# Patient Record
Sex: Female | Born: 1980
Health system: Southern US, Community
[De-identification: ages and names within clinical notes are randomized; demographics above are authoritative.]

## PROBLEM LIST (undated history)

## (undated) DIAGNOSIS — IMO0002 Reserved for concepts with insufficient information to code with codable children: Secondary | ICD-10-CM

## (undated) DIAGNOSIS — T7840XA Allergy, unspecified, initial encounter: Secondary | ICD-10-CM

## (undated) DIAGNOSIS — E785 Hyperlipidemia, unspecified: Secondary | ICD-10-CM

## (undated) DIAGNOSIS — I10 Essential (primary) hypertension: Secondary | ICD-10-CM

## (undated) HISTORY — DX: Essential (primary) hypertension: I10

## (undated) HISTORY — DX: Allergy, unspecified, initial encounter: T78.40XA

## (undated) HISTORY — PX: APPENDECTOMY: SHX54

## (undated) HISTORY — DX: Hyperlipidemia, unspecified: E78.5

## (undated) HISTORY — DX: Reserved for concepts with insufficient information to code with codable children: IMO0002

---

## 1999-03-27 ENCOUNTER — Emergency Department (HOSPITAL_COMMUNITY): Admission: EM | Admit: 1999-03-27 | Discharge: 1999-03-28 | Payer: Self-pay | Admitting: Emergency Medicine

## 1999-03-28 ENCOUNTER — Emergency Department (HOSPITAL_COMMUNITY): Admission: EM | Admit: 1999-03-28 | Discharge: 1999-03-28 | Payer: Self-pay | Admitting: Emergency Medicine

## 1999-03-30 ENCOUNTER — Emergency Department (HOSPITAL_COMMUNITY): Admission: EM | Admit: 1999-03-30 | Discharge: 1999-03-30 | Payer: Self-pay | Admitting: Emergency Medicine

## 2001-02-09 ENCOUNTER — Encounter: Payer: Self-pay | Admitting: Family Medicine

## 2001-02-09 ENCOUNTER — Ambulatory Visit (HOSPITAL_COMMUNITY): Admission: RE | Admit: 2001-02-09 | Discharge: 2001-02-09 | Payer: Self-pay | Admitting: Family Medicine

## 2003-02-07 ENCOUNTER — Other Ambulatory Visit: Admission: RE | Admit: 2003-02-07 | Discharge: 2003-02-07 | Payer: Self-pay | Admitting: Obstetrics & Gynecology

## 2007-10-14 ENCOUNTER — Inpatient Hospital Stay (HOSPITAL_COMMUNITY): Admission: AD | Admit: 2007-10-14 | Discharge: 2007-10-15 | Payer: Self-pay | Admitting: Obstetrics & Gynecology

## 2007-11-14 ENCOUNTER — Encounter (INDEPENDENT_AMBULATORY_CARE_PROVIDER_SITE_OTHER): Payer: Self-pay | Admitting: Surgery

## 2007-11-14 ENCOUNTER — Encounter: Payer: Self-pay | Admitting: Family Medicine

## 2007-11-14 ENCOUNTER — Inpatient Hospital Stay (HOSPITAL_COMMUNITY): Admission: EM | Admit: 2007-11-14 | Discharge: 2007-11-15 | Payer: Self-pay | Admitting: Emergency Medicine

## 2008-01-23 ENCOUNTER — Ambulatory Visit: Payer: Self-pay | Admitting: Gastroenterology

## 2009-04-28 ENCOUNTER — Encounter: Admission: RE | Admit: 2009-04-28 | Discharge: 2009-04-28 | Payer: Self-pay | Admitting: Family Medicine

## 2010-06-19 ENCOUNTER — Emergency Department (HOSPITAL_COMMUNITY): Admission: EM | Admit: 2010-06-19 | Discharge: 2010-06-19 | Payer: Self-pay | Admitting: Family Medicine

## 2010-06-22 ENCOUNTER — Emergency Department (HOSPITAL_COMMUNITY): Admission: EM | Admit: 2010-06-22 | Discharge: 2010-06-22 | Payer: Self-pay | Admitting: Emergency Medicine

## 2010-10-25 ENCOUNTER — Encounter: Payer: Self-pay | Admitting: Family Medicine

## 2010-12-17 LAB — LIPASE, BLOOD: Lipase: 70 U/L — ABNORMAL HIGH (ref 11–59)

## 2010-12-17 LAB — URINALYSIS, ROUTINE W REFLEX MICROSCOPIC
Bilirubin Urine: NEGATIVE
Hgb urine dipstick: NEGATIVE
Ketones, ur: NEGATIVE mg/dL
Nitrite: NEGATIVE
Specific Gravity, Urine: 1.014 (ref 1.005–1.030)
Urobilinogen, UA: 0.2 mg/dL (ref 0.0–1.0)

## 2010-12-17 LAB — DIFFERENTIAL
Basophils Absolute: 0.1 10*3/uL (ref 0.0–0.1)
Basophils Relative: 0 % (ref 0–1)
Eosinophils Absolute: 0.4 10*3/uL (ref 0.0–0.7)
Eosinophils Relative: 3 % (ref 0–5)
Lymphocytes Relative: 10 % — ABNORMAL LOW (ref 12–46)
Monocytes Absolute: 1.4 10*3/uL — ABNORMAL HIGH (ref 0.1–1.0)

## 2010-12-17 LAB — COMPREHENSIVE METABOLIC PANEL
ALT: 18 U/L (ref 0–35)
AST: 19 U/L (ref 0–37)
Albumin: 3.7 g/dL (ref 3.5–5.2)
Alkaline Phosphatase: 63 U/L (ref 39–117)
CO2: 29 mEq/L (ref 19–32)
Chloride: 98 mEq/L (ref 96–112)
Creatinine, Ser: 0.82 mg/dL (ref 0.4–1.2)
GFR calc Af Amer: >60 mL/min (ref >60)
GFR calc non Af Amer: >60 mL/min (ref >60)
Potassium: 3.7 mEq/L (ref 3.5–5.1)
Total Bilirubin: 0.3 mg/dL (ref 0.3–1.2)

## 2010-12-17 LAB — POCT URINALYSIS DIPSTICK
Nitrite: NEGATIVE
Specific Gravity, Urine: 1.02 (ref 1.005–1.030)

## 2010-12-17 LAB — CBC
Hemoglobin: 12.6 g/dL (ref 12.0–15.0)
MCH: 27.8 pg (ref 26.0–34.0)
Platelets: 304 10*3/uL (ref 150–400)
RBC: 4.53 MIL/uL (ref 3.87–5.11)
WBC: 13.6 10*3/uL — ABNORMAL HIGH (ref 4.0–10.5)

## 2010-12-17 LAB — URINE MICROSCOPIC-ADD ON

## 2010-12-17 LAB — POCT PREGNANCY, URINE: Preg Test, Ur: NEGATIVE

## 2010-12-17 LAB — HEMOCCULT GUIAC POC 1CARD (OFFICE): Fecal Occult Bld: NEGATIVE

## 2011-02-16 NOTE — Op Note (Signed)
[name], Julia [name]             ACCOUNT NO.:  [id number]   MEDICAL RECORD NO.:  [id number]          PATIENT TYPE:  INP   LOCATION:  1519                         FACILITY:  [facility]   PHYSICIAN:  Thornton Park. Daphine Deutscher, MD  DATE OF BIRTH:  08/09/1981   DATE OF PROCEDURE:  11/14/2007  DATE OF DISCHARGE:  11/15/2007                               OPERATIVE REPORT   PREOPERATIVE DIAGNOSIS:  Acute appendicitis.   POSTOPERATIVE DIAGNOSIS:  Acute appendicitis.   PROCEDURE:  Laparoscopic appendectomy.   SURGEON:  Thornton Park. Daphine Deutscher, MD   ASSISTANT:  None.   DESCRIPTION OF PROCEDURE:  This is a redictation of this which they  apparently said they cannot find.  At approximately in the early morning  hours of November 14, 2007, she was taken to the operating room.  She  was put asleep and prepped with Techni-Care.  I entered the abdomen  through the umbilicus, with two other trocars, a 5 in the right upper  quadrant and a 10/11 in the left lower quadrant.  The appendix was  mobilized and transected at the base with the Endo-GIA and then the  mesentery was divided with a Harmonic scalpel.  It was placed in a bag  and brought out through the umbilicus.  The patient seemed to tolerate  the procedure well and was taken to the recovery room in satisfactory  condition.   FINAL DIAGNOSIS:  Acute appendicitis.      Thornton Park Daphine Deutscher, MD  Electronically Signed     MBM/MEDQ  D:  01/14/2008  T:  01/14/2008  Job:  [id number]

## 2011-02-16 NOTE — Assessment & Plan Note (Signed)
[name]                         GASTROENTEROLOGY OFFICE NOTE   NAME:[name], Julia [name]                    MRN:          [id number]  DATE:01/23/2008                            DOB:          Sep 05, 1981    CHIEF COMPLAINT:  Dyspeptic symptoms.   HISTORY OF PRESENT ILLNESS:  Julia [name] is a very pleasant 30 year old  woman who has had approximately 4-5 months of abdominal symptoms.  Specifically she has gurgling in her abdomen especially when it is  quite.  She was having some intermittent abdominal pains.  She has also  noticed a loosening of her stools, more runny than usual.  She still  only moves her bowels once a day but has noticed a change in the  character.  She was seen at [facility] and was treated  empirically with Flagyl twice for the suspicion of Giardia.  She  definitely felt better after those treatments but has noted recently  that the belching and bloating and loosening of her stools recurring.  She has had blood tests and stool testing.  She tells me that Giardia  was not found in her stool.  Her liver tests that were sent to [place] were  all completely normal.   Of interest, she and her mother and most of her family are lactose  intolerant.  She drinks Lactate brand milk, but she has been eating  yogurt, 1-2 yogurts a day for the past several months.  She has not been  taking Lactaid pills.   REVIEW OF SYSTEMS:  Notable for intentional 10 pound weight loss and is  otherwise essentially normal and is available on her nursing intake  sheet.   PAST MEDICAL HISTORY:  1. Mild asthma.  2. Seasonal allergies.  3. Status post appendectomy February 2009.   CURRENT MEDICINES:  Alavert, Nasonex, birth control pills.   ALLERGIES:  SULFA ANTIBIOTICS.   SOCIAL HISTORY:  Single.  Works as a Education officer, environmental.  Nonsmoker.  Nondrinker.  Drinks 2 caffeinated beverages a day.  No children.   FAMILY HISTORY:  Diabetes and heart disease  run in her family.  No colon  cancer in her family.   PHYSICAL EXAMINATION:  Height is 5 feet 4 inches, 169 pounds.  Blood  pressure 110/70.  Pulse 72.  CONSTITUTIONAL:  Generally well-appearing.  NEUROLOGIC:  Alert and oriented x3.  HEENT:  Eyes:  Extraocular eye movements intact.  Mouth:  Oropharynx  moist, no lesions.  NECK:  Supple.  No lymphadenopathy.  CARDIOVASCULAR:  Heart:  Regular rate rhythm.  LUNGS:  Clear to auscultation bilaterally.  ABDOMEN:  Soft, nontender, nondistended.  Normal bowel sounds.  EXTREMITIES:  No lower extremity edema.  SKIN:  No rashes or lesions on extremities.   ASSESSMENT AND PLAN:  A 30 year old woman with history of lactose  intolerance, belching, bloating, loose stools.   Suspicious that she is having continued troubles with lactose  intolerance.  She has been eating 1-2 yogurts a day for several months  and has symptoms that are definitely consistent with lactose  intolerance.  The question of Giardia is interesting and she may  have  indeed had Giardia.  Stool testing argues against that; however, we may  need to revisit that question in the future.  For now I want to keep  this simple, and I have recommended that she completely refrain from any  dairy product for the next 10 days.  If at the end of that time she is  feeling much better and has had no recurrence of her symptoms, then she  will have a day of dairy challenge.  She will return to see me in 4-5  weeks and sooner if needed.     Rachael Fee, MD  Electronically Signed    DPJ/MedQ  DD: 01/23/2008  DT: 01/23/2008  Job #: [id number]

## 2011-04-26 ENCOUNTER — Inpatient Hospital Stay (INDEPENDENT_AMBULATORY_CARE_PROVIDER_SITE_OTHER)
Admission: RE | Admit: 2011-04-26 | Discharge: 2011-04-26 | Disposition: A | Discharge status: Home / Self Care | Payer: BC Managed Care – PPO | Source: Ambulatory Visit | Attending: Emergency Medicine | Admitting: Emergency Medicine

## 2011-04-26 DIAGNOSIS — J029 Acute pharyngitis, unspecified: Secondary | ICD-10-CM

## 2011-06-24 LAB — URINALYSIS, ROUTINE W REFLEX MICROSCOPIC
Bilirubin Urine: NEGATIVE
Hgb urine dipstick: NEGATIVE
Ketones, ur: NEGATIVE
Specific Gravity, Urine: 1.01
Urobilinogen, UA: 0.2
pH: 7.5

## 2011-06-24 LAB — POCT PREGNANCY, URINE
Operator id: 28886
Preg Test, Ur: NEGATIVE

## 2011-06-24 LAB — COMPREHENSIVE METABOLIC PANEL
CO2: 26
Calcium: 8.9
Creatinine, Ser: 0.86
GFR calc non Af Amer: >60
Glucose, Bld: 86
Total Protein: 7

## 2011-06-24 LAB — CBC
Hemoglobin: 12.2
MCHC: 34
MCV: 85.5
RBC: 4.2
RDW: 12.8

## 2011-06-24 LAB — DIFFERENTIAL
Lymphocytes Relative: 13
Lymphs Abs: 1.7
Neutro Abs: 10.2 — ABNORMAL HIGH
Neutrophils Relative %: 76

## 2011-06-24 LAB — WET PREP, GENITAL
Clue Cells Wet Prep HPF POC: NONE SEEN
Trich, Wet Prep: NONE SEEN
Yeast Wet Prep HPF POC: NONE SEEN

## 2011-06-24 LAB — GC/CHLAMYDIA PROBE AMP, GENITAL: GC Probe Amp, Genital: NEGATIVE

## 2011-06-25 LAB — URINALYSIS, ROUTINE W REFLEX MICROSCOPIC
Bilirubin Urine: NEGATIVE
Glucose, UA: NEGATIVE
Protein, ur: NEGATIVE

## 2011-06-25 LAB — CBC
HCT: 35.5 — ABNORMAL LOW
Hemoglobin: 11.5 — ABNORMAL LOW
Hemoglobin: 12
MCV: 83
MCV: 83.5
RBC: 4.07
RBC: 4.28
WBC: 10
WBC: 17 — ABNORMAL HIGH

## 2011-06-25 LAB — DIFFERENTIAL
Eosinophils Absolute: 0.4
Eosinophils Relative: 2
Lymphs Abs: 2.1
Monocytes Absolute: 1.3 — ABNORMAL HIGH
Monocytes Relative: 8

## 2011-06-25 LAB — BASIC METABOLIC PANEL
BUN: 7
Creatinine, Ser: 0.85
GFR calc non Af Amer: >60
Glucose, Bld: 94
Potassium: 3.7

## 2011-06-25 LAB — URINE MICROSCOPIC-ADD ON

## 2011-11-14 ENCOUNTER — Ambulatory Visit (INDEPENDENT_AMBULATORY_CARE_PROVIDER_SITE_OTHER): Payer: BC Managed Care – PPO | Admitting: Family Medicine

## 2011-11-14 DIAGNOSIS — E039 Hypothyroidism, unspecified: Secondary | ICD-10-CM

## 2011-11-14 DIAGNOSIS — J329 Chronic sinusitis, unspecified: Secondary | ICD-10-CM

## 2011-11-14 DIAGNOSIS — J45909 Unspecified asthma, uncomplicated: Secondary | ICD-10-CM | POA: Insufficient documentation

## 2011-11-14 DIAGNOSIS — I10 Essential (primary) hypertension: Secondary | ICD-10-CM | POA: Insufficient documentation

## 2011-11-14 DIAGNOSIS — J0100 Acute maxillary sinusitis, unspecified: Secondary | ICD-10-CM

## 2011-11-14 LAB — TSH: TSH: 3.236 u[IU]/mL (ref 0.350–4.500)

## 2011-11-14 MED ORDER — FLUTICASONE PROPIONATE 50 MCG/ACT NA SUSP: 2.0000 | Freq: Every day | NASAL | Status: DC

## 2011-11-14 MED ORDER — AMOXICILLIN 875 MG PO TABS: 875.0000 mg | ORAL_TABLET | Freq: Two times a day (BID) | ORAL | Status: DC

## 2011-11-14 MED ORDER — AMOXICILLIN 875 MG PO TABS: 875.0000 mg | ORAL_TABLET | Freq: Two times a day (BID) | ORAL | Status: AC

## 2011-11-14 NOTE — Progress Notes (Signed)
  Subjective:    Patient ID: Julia [name], female    DOB: 10-31-1980, 31 y.o.   MRN: [id number]  Sinusitis This is a new problem. The current episode started 1 to 4 weeks ago. The problem has been gradually worsening since onset. There has been no fever. Her pain is at a severity of 4/10. The pain is mild. Associated symptoms include congestion, headaches, sinus pressure and a sore throat. Past treatments include oral decongestants.      Review of Systems  HENT: Positive for congestion, sore throat and sinus pressure.   Neurological: Positive for headaches.       Objective:   Physical Exam  Constitutional: She appears well-developed and well-nourished.  HENT:  Right Ear: Tympanic membrane is retracted.  Left Ear: Tympanic membrane is retracted.  Nose: Mucosal edema, rhinorrhea and sinus tenderness present.    Mouth/Throat: Mucous membranes are normal. Posterior oropharyngeal erythema: pnd.  Cardiovascular: Normal rate, regular rhythm and normal heart sounds.   Pulmonary/Chest: Effort normal and breath sounds normal.  Lymphadenopathy:    She has cervical adenopathy (1+ anterior nodes (B)).  Neurological: She is alert.  Skin: Skin is warm.          Assessment & Plan:

## 2011-11-18 NOTE — Progress Notes (Signed)
Quick Note:  Please call patient with normal labs. Thanks! KR  ______ 

## 2012-03-21 ENCOUNTER — Other Ambulatory Visit: Payer: Self-pay | Admitting: Family Medicine

## 2012-03-22 ENCOUNTER — Other Ambulatory Visit: Payer: Self-pay | Admitting: *Deleted

## 2012-03-22 MED ORDER — LEVOTHYROXINE SODIUM 75 MCG PO TABS: 75.0000 ug | ORAL_TABLET | Freq: Every day | ORAL | Status: DC

## 2012-04-18 ENCOUNTER — Ambulatory Visit (INDEPENDENT_AMBULATORY_CARE_PROVIDER_SITE_OTHER): Payer: BC Managed Care – PPO | Admitting: Physician Assistant

## 2012-04-18 VITALS — BP 120/82 | HR 71 | Temp 98.2°F | Resp 16 | Ht 63.0 in | Wt 171.2 lb

## 2012-04-18 DIAGNOSIS — J45909 Unspecified asthma, uncomplicated: Secondary | ICD-10-CM

## 2012-04-18 DIAGNOSIS — E039 Hypothyroidism, unspecified: Secondary | ICD-10-CM

## 2012-04-18 DIAGNOSIS — I10 Essential (primary) hypertension: Secondary | ICD-10-CM

## 2012-04-18 DIAGNOSIS — N946 Dysmenorrhea, unspecified: Secondary | ICD-10-CM

## 2012-04-18 MED ORDER — MELOXICAM 7.5 MG PO TABS: 7.5000 mg | ORAL_TABLET | Freq: Every day | ORAL | Status: AC | PRN

## 2012-04-18 MED ORDER — LISINOPRIL 10 MG PO TABS: 10.0000 mg | ORAL_TABLET | Freq: Every day | ORAL | Status: DC

## 2012-04-18 MED ORDER — ALBUTEROL SULFATE HFA 108 (90 BASE) MCG/ACT IN AERS: 2.0000 | INHALATION_SPRAY | Freq: Four times a day (QID) | RESPIRATORY_TRACT | Status: DC | PRN

## 2012-04-18 MED ORDER — MONTELUKAST SODIUM 10 MG PO TABS: 10.0000 mg | ORAL_TABLET | Freq: Every day | ORAL | Status: DC

## 2012-04-18 NOTE — Progress Notes (Signed)
  Subjective:    Patient ID: Julia [name], female    DOB: 1981/09/13, 31 y.o.   MRN: [id number]  HPI Pt presents to clinic for recheck medical problems and rx refills.  She is doing well with her BP meds.  Her asthma is under good control - she rarely has to use her albuterol.  She does c/o menstrual cramping that is severe that starts 1 wk prior to menses and continues throughout her bleeding.  She has been on OCP in the past but not sexually active so wants to try to stay off hormones but needs something more than motrin for her cramps.   Review of Systems  Constitutional: Negative for fever and chills.  Respiratory: Negative for cough.   Cardiovascular: Negative for chest pain and leg swelling.  Genitourinary: Positive for menstrual problem.       Objective:   Physical Exam  Vitals reviewed. Constitutional: She is oriented to person, place, and time. She appears well-developed and well-nourished.  HENT:  Head: Normocephalic and atraumatic.  Right Ear: External ear normal.  Left Ear: External ear normal.  Nose: Nose normal.  Eyes: Conjunctivae are normal.  Neck: Normal range of motion. No thyromegaly present.  Cardiovascular: Normal rate, regular rhythm and normal heart sounds.   No murmur heard. Pulmonary/Chest: Effort normal and breath sounds normal.       No LE edema   Neurological: She is alert and oriented to person, place, and time.  Skin: Skin is warm and dry.  Psychiatric: She has a normal mood and affect. Her behavior is normal. Judgment and thought content normal.          Assessment & Plan:   1. Hypothyroidism  TSH  2. HTN (hypertension)  Comprehensive metabolic panel, lisinopril (PRINIVIL,ZESTRIL) 10 MG tablet  3. Asthma  montelukast (SINGULAIR) 10 MG tablet, albuterol (PROVENTIL HFA;VENTOLIN HFA) 108 (90 BASE) MCG/ACT inhaler  4. Dysmenorrhea  meloxicam (MOBIC) 7.5 MG tablet   Refilled patient's medication. Will refill her synthroid once her labs  return.  Will try mobic to see if improvement in dysmenorrhea - pt's last pap 1/10 and we will plan on doing that in 6 months.  Pt understands and agrees with the above plan.

## 2012-04-19 LAB — COMPREHENSIVE METABOLIC PANEL
ALT: 15 U/L (ref 0–35)
AST: 18 U/L (ref 0–37)
Albumin: 4.6 g/dL (ref 3.5–5.2)
BUN: 10 mg/dL (ref 6–23)
Calcium: 9.8 mg/dL (ref 8.4–10.5)
Chloride: 103 mEq/L (ref 96–112)
Potassium: 4.1 mEq/L (ref 3.5–5.3)

## 2012-04-19 LAB — TSH: TSH: 9.277 u[IU]/mL — ABNORMAL HIGH (ref 0.350–4.500)

## 2012-04-20 ENCOUNTER — Other Ambulatory Visit: Payer: Self-pay | Admitting: Physician Assistant

## 2012-04-20 DIAGNOSIS — E039 Hypothyroidism, unspecified: Secondary | ICD-10-CM

## 2012-04-20 MED ORDER — LEVOTHYROXINE SODIUM 88 MCG PO TABS: 88.0000 ug | ORAL_TABLET | Freq: Every day | ORAL | Status: DC

## 2012-06-14 ENCOUNTER — Telehealth: Payer: Self-pay

## 2012-06-14 DIAGNOSIS — E039 Hypothyroidism, unspecified: Secondary | ICD-10-CM

## 2012-06-14 NOTE — Telephone Encounter (Signed)
Maralyn Sago, you wanted pt to recheck TSH in 2 mos (from 04/18/12). Do you want to authorize 1 more RF or must pt repeat labs first?

## 2012-06-14 NOTE — Telephone Encounter (Signed)
Patient would like to know if she can have another refill of synthroid before having an OV. Please call patient at [phone].

## 2012-06-15 MED ORDER — LEVOTHYROXINE SODIUM 88 MCG PO TABS: 88.0000 ug | ORAL_TABLET | Freq: Every day | ORAL | Status: DC

## 2012-06-15 NOTE — Telephone Encounter (Signed)
I sent in a RF but she must have an ov before more refills are given.

## 2012-06-15 NOTE — Telephone Encounter (Signed)
Called patient to advise Rx sent in for her, and she needs office visit for further renewals.

## 2012-07-05 ENCOUNTER — Ambulatory Visit (INDEPENDENT_AMBULATORY_CARE_PROVIDER_SITE_OTHER): Payer: BC Managed Care – PPO | Admitting: Family Medicine

## 2012-07-05 VITALS — BP 124/78 | HR 83 | Temp 98.0°F | Resp 17 | Ht 63.5 in | Wt 171.0 lb

## 2012-07-05 DIAGNOSIS — N946 Dysmenorrhea, unspecified: Secondary | ICD-10-CM

## 2012-07-05 DIAGNOSIS — K219 Gastro-esophageal reflux disease without esophagitis: Secondary | ICD-10-CM

## 2012-07-05 DIAGNOSIS — J029 Acute pharyngitis, unspecified: Secondary | ICD-10-CM

## 2012-07-05 DIAGNOSIS — F79 Unspecified intellectual disabilities: Secondary | ICD-10-CM | POA: Insufficient documentation

## 2012-07-05 DIAGNOSIS — R110 Nausea: Secondary | ICD-10-CM

## 2012-07-05 DIAGNOSIS — E039 Hypothyroidism, unspecified: Secondary | ICD-10-CM

## 2012-07-05 LAB — POCT CBC
Hemoglobin: 12.4 g/dL (ref 12.2–16.2)
MPV: 11.2 fL (ref 0–99.8)
POC Granulocyte: 6.7 (ref 2–6.9)
POC MID %: 6.5 %M (ref 0–12)
RBC: 4.34 M/uL (ref 4.04–5.48)

## 2012-07-05 LAB — POCT URINALYSIS DIPSTICK
Bilirubin, UA: NEGATIVE
Glucose, UA: NEGATIVE
Leukocytes, UA: NEGATIVE
Nitrite, UA: NEGATIVE

## 2012-07-05 LAB — POCT UA - MICROSCOPIC ONLY
Casts, Ur, LPF, POC: NEGATIVE
RBC, urine, microscopic: NEGATIVE

## 2012-07-05 MED ORDER — SUCRALFATE 1 G PO TABS: 1.0000 g | ORAL_TABLET | Freq: Four times a day (QID) | ORAL | Status: DC

## 2012-07-05 MED ORDER — GI COCKTAIL ~~LOC~~: 30.0000 mL | Freq: Once | ORAL | Status: DC

## 2012-07-05 MED ORDER — OMEPRAZOLE 40 MG PO CPDR: 40.0000 mg | DELAYED_RELEASE_CAPSULE | Freq: Every day | ORAL | Status: DC

## 2012-07-05 MED ORDER — LEVONORGESTREL-ETHINYL ESTRAD 0.1-20 MG-MCG PO TABS: 1.0000 | ORAL_TABLET | Freq: Every day | ORAL | Status: DC

## 2012-07-05 NOTE — Progress Notes (Addendum)
[facility] [address], [place] [address] [phone]  Date:  07/05/2012   Name:  Julia [name]   DOB:  [birth year]   MRN:  [id number]  PCP:  No primary provider on file.    Chief Complaint: Nausea, Sore Throat and Abdominal Pain   History of Present Illness:  Julia [name] is a 31 y.o. very pleasant female patient who presents with the following:  Of note Julia [name] has a mental handicap- she is currently enrolled in HS.  Her mother is here with her today- she helps her with driving, etc.   She has been nauseated for 3 weeks and has noted stomach cramping. She has also had a sore throat for 3 weeks.  She has felt warm but has not had a fever that they have measured.    She has noted some right sided CP for about a week.  She notes it more when she lies down at night. She does not notice it when she dances for exercise.    No history of CAD.  No SOB- she does have some baseline asthma.   She did throw up some alka- selter yesterday.   No diarrhea.   Appetite is ok.  Eating does not change her symptoms.  Her discomfort can be worse when she lies down at night.   She has taken some ibuprofen as well.   She has been told that she had an ulcer in the past which healed with nexium.    She is also on pepcid [facility] currently.    Julia [name] is also concerned about pains and cramping that occur with her periods.  She was on OCP about 10 years ago.  She had some SE (not an allergic rxn) that caused her to stop using them.  However, she is willing to try again as her dysmenorrhea is significant.   Her LMP was in September- about 2 weeks ago.  No migraine HA.  No history of DVT or PE.    Patient Active Problem List  Diagnosis  . Asthma  . HTN (hypertension)  . Hypothyroid    Past Medical History  Diagnosis Date  . Allergy   . Asthma   . Hyperlipidemia   . Hypertension     No past surgical history on file.  History  Substance Use Topics  . Smoking  status: Never Smoker   . Smokeless tobacco: Not on file  . Alcohol Use: Not on file    No family history on file.  Allergies  Allergen Reactions  . Sulfa Antibiotics Nausea And Vomiting    Medication list has been reviewed and updated.  Current Outpatient Prescriptions on File Prior to Visit  Medication Sig Dispense Refill  . albuterol (PROVENTIL HFA;VENTOLIN HFA) 108 (90 BASE) MCG/ACT inhaler Inhale 2 puffs into the lungs every 6 (six) hours as needed for wheezing.  1 Inhaler  0  . ibuprofen (ADVIL,MOTRIN) 200 MG tablet Take 600 mg by mouth every 6 (six) hours as needed.      Marland Kitchen levothyroxine (SYNTHROID, LEVOTHROID) 88 MCG tablet Take 1 tablet (88 mcg total) by mouth daily.  30 tablet  0  . lisinopril (PRINIVIL,ZESTRIL) 10 MG tablet Take 1 tablet (10 mg total) by mouth daily.  90 tablet  1  . montelukast (SINGULAIR) 10 MG tablet Take 1 tablet (10 mg total) by mouth at bedtime.  90 tablet  3  . fluticasone (FLONASE) 50 MCG/ACT nasal spray Place 2 sprays into  the nose daily.  1 g  6  . meloxicam (MOBIC) 7.5 MG tablet Take 1-2 tablets (7.5-15 mg total) by mouth daily as needed for pain.  60 tablet  5    Review of Systems:  As per HPI- otherwise negative.   Physical Examination: Filed Vitals:   07/05/12 1010  BP: 124/78  Pulse: 83  Temp: 98 F (36.7 C)  Resp: 17   Filed Vitals:   07/05/12 1010  Height: 5' 3.5" (1.613 m)  Weight: 171 lb (77.565 kg)   Body mass index is 29.82 kg/(m^2). Ideal Body Weight: Weight in (lb) to have BMI = 25: 143.1   GEN: WDWN, NAD, Non-toxic, A & O x 3, overweight HEENT: Atraumatic, Normocephalic. Neck supple. No masses, No LAD. Bilateral TM wnl, oropharynx normal.  PEERL,EOMI.   Ears and Nose: No external deformity. CV: RRR, No M/G/R. No JVD. No thrill. No extra heart sounds. I am able to easily reproduce her CP by pressing on her right chest wall.  No lesion or redness of the skin.   PULM: CTA B, no wheezes, crackles, rhonchi. No  retractions. No resp. distress. No accessory muscle use. ABD: S, ND, +BS. No rebound. No HSM. Slight tenderness in the epigastric area.  EXTR: No c/c/e NEURO Normal gait.  PSYCH: slightly childish but pleasant demeanor. Conversant. Not depressed or anxious appearing.    Results for orders placed in visit on 07/05/12  POCT RAPID STREP A (OFFICE)      Component Value Range   Rapid Strep A Screen Negative  Negative  POCT CBC      Component Value Range   WBC 9.7  4.6 - 10.2 K/uL   Lymph, poc 2.4  0.6 - 3.4   POC LYMPH PERCENT 24.3  10 - 50 %L   MID (cbc) 0.6  0 - 0.9   POC MID % 6.5  0 - 12 %M   POC Granulocyte 6.7  2 - 6.9   Granulocyte percent 69.2  37 - 80 %G   RBC 4.34  4.04 - 5.48 M/uL   Hemoglobin 12.4  12.2 - 16.2 g/dL   HCT, POC 40.9  81.1 - 47.9 %   MCV 90.4  80 - 97 fL   MCH, POC 28.6  27 - 31.2 pg   MCHC 31.6 (*) 31.8 - 35.4 g/dL   RDW, POC 91.4     Platelet Count, POC 296  142 - 424 K/uL   MPV 11.2  0 - 99.8 fL  POCT URINALYSIS DIPSTICK      Component Value Range   Color, UA yellow     Clarity, UA clear     Glucose, UA neg     Bilirubin, UA neg     Ketones, UA neg     Spec Grav, UA 1.010     Blood, UA neg     pH, UA 7.0     Protein, UA neg     Urobilinogen, UA 0.2     Nitrite, UA neg     Leukocytes, UA Negative    POCT UA - MICROSCOPIC ONLY      Component Value Range   WBC, Ur, HPF, POC 0-1     RBC, urine, microscopic neg     Bacteria, U Microscopic trace     Mucus, UA neg     Epithelial cells, urine per micros 0-2     Crystals, Ur, HPF, POC neg     Casts, Ur, LPF, POC neg  Yeast, UA neg    POCT URINE PREGNANCY      Component Value Range   Preg Test, Ur Negative     GI cocktail: this did help with her nausea. Also partially relieved her stomach discomfort    Assessment and Plan: 1. GERD (gastroesophageal reflux disease)  gi cocktail (Maalox,Lidocaine,Donnatal), sucralfate (CARAFATE) 1 G tablet, omeprazole (PRILOSEC) 40 MG capsule  2. Sore throat   POCT rapid strep A, POCT CBC, Culture, Group A Strep  3. Hypothyroidism  TSH  4. Nausea  POCT urinalysis dipstick, POCT UA - Microscopic Only, POCT urine pregnancy  5. Menstrual cramps  levonorgestrel-ethinyl estradiol (AVIANE,ALESSE,LESSINA) 0.1-20 MG-MCG tablet  6. Mental handicap     Suspect that Julia [name]'s symptoms are caused by GERD.  She is on an H2 blocker right now- will change to a PPI.  She has done well with nexium but it is not covered well by their insurance.  Will use 40 mg prilosec instead.  Also carafate, and went over acidic foods to avoid.  Her CP is due to MSK soreness.   Encouraged her to use tylenol instead of NSAIDs for aches and pains, as NSAIDS may exacerbate her stomach problems.  Her ST is probably also due to acid reflux  Will start on OCP for dysmenorrhea.   Went over starting after next period and how to use pills.  If she does not tolerate her pills please let me know She is due a recheck of her TSH- await and will call with results.   Also gave a letter in response to a recent call- up for [employer] duty.  Explained that Julia [name] should be excused from jury duty permanently due to her handicap.    Abbe Amsterdam, MD 07/08/12- called and spoke with Julia [name].  Offered to speak with her mom, but Julia [name] felt comfortable with the information.  I let her know that her TSH was a little bit high, so I am going to increase her synthroid from 88 to 100 mcg, and will send this rx to her pharmacy.  We will plan to have them recheck a TSH in about 6 weeks.  Otherwise she stated that she is feeling better and doing ok overall. She did have another increase of her thyroid meds in July of this year.   I think she was on 50 mcg prior to this change

## 2012-07-07 LAB — CULTURE, GROUP A STREP: Organism ID, Bacteria: NORMAL

## 2012-07-08 MED ORDER — LEVOTHYROXINE SODIUM 100 MCG PO TABS: 100.0000 ug | ORAL_TABLET | Freq: Every day | ORAL | Status: DC

## 2012-07-08 NOTE — Addendum Note (Signed)
Addended by: Abbe Amsterdam C on: 07/08/2012 05:19 PM   Modules accepted: Orders

## 2012-08-22 ENCOUNTER — Ambulatory Visit (INDEPENDENT_AMBULATORY_CARE_PROVIDER_SITE_OTHER): Payer: BC Managed Care – PPO | Admitting: Emergency Medicine

## 2012-08-22 VITALS — BP 112/72 | HR 94 | Temp 97.6°F | Resp 18 | Ht 62.5 in | Wt 181.2 lb

## 2012-08-22 DIAGNOSIS — J018 Other acute sinusitis: Secondary | ICD-10-CM

## 2012-08-22 DIAGNOSIS — J029 Acute pharyngitis, unspecified: Secondary | ICD-10-CM

## 2012-08-22 MED ORDER — PSEUDOEPHEDRINE-GUAIFENESIN ER 60-600 MG PO TB12: 1.0000 | ORAL_TABLET | Freq: Two times a day (BID) | ORAL | Status: AC

## 2012-08-22 MED ORDER — AMOXICILLIN 875 MG PO TABS: 875.0000 mg | ORAL_TABLET | Freq: Two times a day (BID) | ORAL | Status: DC

## 2012-08-22 NOTE — Progress Notes (Signed)
[facility] [address], [place] [address] [phone]  Date:  08/22/2012   Name:  Julia [name]   DOB:  10-Mar-1981   MRN:  [id number]  PCP:  No primary provider on file.    Chief Complaint: Sinusitis, Sore Throat, Headache and Cough   History of Present Illness:  Julia [name] is a 31 y.o. very pleasant female patient who presents with the following:  Ill for two weeks with a sore throat and nasal congestion and post nasal drainage.  Bad tasting. Clear nasal discharge.  No fever or chills, wheezing or shortness of breath.  No nausea of vomiting.  Some headache.  Patient Active Problem List  Diagnosis  . Asthma  . HTN (hypertension)  . Hypothyroid  . Mental handicap    Past Medical History  Diagnosis Date  . Allergy   . Asthma   . Hyperlipidemia   . Hypertension   . Ulcer     Past Surgical History  Procedure Date  . Appendectomy     History  Substance Use Topics  . Smoking status: Never Smoker   . Smokeless tobacco: Not on file  . Alcohol Use: Not on file    Family History  Problem Relation Age of Onset  . Hypertension Mother   . Hypertension Father   . Hypertension Brother   . Asthma Maternal Grandmother   . Hypertension Maternal Grandfather   . Arthritis Paternal Grandmother   . COPD Paternal Grandfather     Allergies  Allergen Reactions  . Azithromycin Anaphylaxis    z-pac  . Latex Shortness Of Breath  . Sulfa Antibiotics Shortness Of Breath and Nausea And Vomiting    Medication list has been reviewed and updated.  Current Outpatient Prescriptions on File Prior to Visit  Medication Sig Dispense Refill  . albuterol (PROVENTIL HFA;VENTOLIN HFA) 108 (90 BASE) MCG/ACT inhaler Inhale 2 puffs into the lungs every 6 (six) hours as needed for wheezing.  1 Inhaler  0  . ibuprofen (ADVIL,MOTRIN) 200 MG tablet Take 600 mg by mouth every 6 (six) hours as needed.      Marland Kitchen levonorgestrel-ethinyl estradiol  (AVIANE,ALESSE,LESSINA) 0.1-20 MG-MCG tablet Take 1 tablet by mouth daily.  1 Package  11  . levothyroxine (SYNTHROID, LEVOTHROID) 100 MCG tablet Take 1 tablet (100 mcg total) by mouth daily.  30 tablet  6  . lisinopril (PRINIVIL,ZESTRIL) 10 MG tablet Take 1 tablet (10 mg total) by mouth daily.  90 tablet  1  . montelukast (SINGULAIR) 10 MG tablet Take 1 tablet (10 mg total) by mouth at bedtime.  90 tablet  3  . omeprazole (PRILOSEC) 40 MG capsule Take 1 capsule (40 mg total) by mouth daily.  30 capsule  3  . fluticasone (FLONASE) 50 MCG/ACT nasal spray Place 2 sprays into the nose daily.  1 g  6  . meloxicam (MOBIC) 7.5 MG tablet Take 1-2 tablets (7.5-15 mg total) by mouth daily as needed for pain.  60 tablet  5  . sucralfate (CARAFATE) 1 G tablet Take 1 tablet (1 g total) by mouth 4 (four) times daily.  40 tablet  0    Review of Systems:  As per HPI, otherwise negative.    Physical Examination: Filed Vitals:   08/22/12 1910  BP: 112/72  Pulse: 94  Temp: 97.6 F (36.4 C)  Resp: 18   Filed Vitals:   08/22/12 1910  Height: 5' 2.5" (1.588 m)  Weight: 181 lb 3.2 oz (  82.192 kg)   Body mass index is 32.61 kg/(m^2). Ideal Body Weight: Weight in (lb) to have BMI = 25: 138.6   GEN: WDWN, NAD, Non-toxic, A & O x 3  No rash or sepsis or shortness of breath HEENT: Atraumatic, Normocephalic. Neck supple. No masses, No LAD.  Oropharynx erythematous  Tender over right frontal and maxillary sinuses Ears and Nose: No external deformity.  TM negative.  Green nasal drainage CV: RRR, No M/G/R. No JVD. No thrill. No extra heart sounds. PULM: CTA B, no wheezes, crackles, rhonchi. No retractions. No resp. distress. No accessory muscle use. ABD: S, NT, ND, +BS. No rebound. No HSM. EXTR: No c/c/e NEURO Normal gait.  PSYCH: Normally interactive. Conversant. Not depressed or anxious appearing.  Calm demeanor.    Assessment and Plan: Sinusitis Pharyngitis Amoxicillin mucinex Follow up as  needed  Carmelina Dane, MD

## 2012-08-30 NOTE — Progress Notes (Signed)
Reviewed and agree.

## 2012-10-18 ENCOUNTER — Ambulatory Visit (INDEPENDENT_AMBULATORY_CARE_PROVIDER_SITE_OTHER): Payer: BC Managed Care – PPO | Admitting: Family Medicine

## 2012-10-18 VITALS — BP 118/83 | HR 89 | Temp 98.1°F | Resp 16 | Ht 62.5 in | Wt 182.0 lb

## 2012-10-18 DIAGNOSIS — N760 Acute vaginitis: Secondary | ICD-10-CM

## 2012-10-18 DIAGNOSIS — B379 Candidiasis, unspecified: Secondary | ICD-10-CM

## 2012-10-18 DIAGNOSIS — B9689 Other specified bacterial agents as the cause of diseases classified elsewhere: Secondary | ICD-10-CM

## 2012-10-18 DIAGNOSIS — N898 Other specified noninflammatory disorders of vagina: Secondary | ICD-10-CM

## 2012-10-18 DIAGNOSIS — R300 Dysuria: Secondary | ICD-10-CM

## 2012-10-18 LAB — POCT UA - MICROSCOPIC ONLY
Casts, Ur, LPF, POC: NEGATIVE
Crystals, Ur, HPF, POC: NEGATIVE
Renal tubular cells: POSITIVE
Yeast, UA: NEGATIVE

## 2012-10-18 LAB — POCT URINALYSIS DIPSTICK
Bilirubin, UA: NEGATIVE
Blood, UA: NEGATIVE
Glucose, UA: NEGATIVE
Ketones, UA: NEGATIVE
Leukocytes, UA: NEGATIVE
Nitrite, UA: NEGATIVE
Protein, UA: NEGATIVE
Spec Grav, UA: 1.015
Urobilinogen, UA: 0.2
pH, UA: 5.5

## 2012-10-18 LAB — POCT WET PREP WITH KOH
KOH Prep POC: POSITIVE
Trichomonas, UA: NEGATIVE
Yeast Wet Prep HPF POC: POSITIVE

## 2012-10-18 MED ORDER — METRONIDAZOLE 500 MG PO TABS: 500.0000 mg | ORAL_TABLET | Freq: Two times a day (BID) | ORAL | Status: DC

## 2012-10-18 MED ORDER — FLUCONAZOLE 150 MG PO TABS: 150.0000 mg | ORAL_TABLET | Freq: Once | ORAL | Status: DC

## 2012-10-18 NOTE — Progress Notes (Signed)
[facility]:  Office Visit  Chief Complaint:  Chief Complaint  Patient presents with  . Vaginitis    2-3 weeks- has been using OTC Monistat  . Sinusitis    * 2 weeks    HPI: Julia [name] is a 32 y.o. female who complains of: Vaginal dc x 2-3 weeks, described as  thick chunky white. Tried otc monistat without relief.  Burning sensation with urination. Denies fevers, chills, pelvic pain. Has never been sexually active.  Past Medical History  Diagnosis Date  . Allergy   . Asthma   . Hyperlipidemia   . Hypertension   . Ulcer    Past Surgical History  Procedure Date  . Appendectomy    History   Social History  . Marital Status: Single    Spouse Name: N/A    Number of Children: N/A  . Years of Education: N/A   Social History Main Topics  . Smoking status: Never Smoker   . Smokeless tobacco: None  . Alcohol Use: No  . Drug Use: No  . Sexually Active: No   Other Topics Concern  . None   Social History Narrative  . None   Family History  Problem Relation Age of Onset  . Hypertension Mother   . Hypertension Father   . Hypertension Brother   . Asthma Maternal Grandmother   . Hypertension Maternal Grandfather   . Arthritis Paternal Grandmother   . COPD Paternal Grandfather    Allergies  Allergen Reactions  . Azithromycin Anaphylaxis    z-pac  . Latex Shortness Of Breath  . Sulfa Antibiotics Shortness Of Breath and Nausea And Vomiting   Prior to Admission medications   Medication Sig Start Date End Date Taking? Authorizing Provider  albuterol (PROVENTIL HFA;VENTOLIN HFA) 108 (90 BASE) MCG/ACT inhaler Inhale 2 puffs into the lungs every 6 (six) hours as needed for wheezing. 04/18/12 04/18/13 Yes Sarah Harvie Bridge, PA-C  ibuprofen (ADVIL,MOTRIN) 200 MG tablet Take 600 mg by mouth every 6 (six) hours as needed.   Yes Historical Provider, MD  levonorgestrel-ethinyl estradiol (AVIANE,ALESSE,LESSINA) 0.1-20 MG-MCG tablet Take 1 tablet by mouth  daily. 07/05/12  Yes Gwenlyn Found Copland, MD  levothyroxine (SYNTHROID, LEVOTHROID) 100 MCG tablet Take 1 tablet (100 mcg total) by mouth daily. 07/08/12  Yes Gwenlyn Found Copland, MD  lisinopril (PRINIVIL,ZESTRIL) 10 MG tablet Take 1 tablet (10 mg total) by mouth daily. 04/18/12  Yes Morrell Riddle, PA-C  montelukast (SINGULAIR) 10 MG tablet Take 1 tablet (10 mg total) by mouth at bedtime. 04/18/12  Yes Morrell Riddle, PA-C  omeprazole (PRILOSEC) 40 MG capsule Take 1 capsule (40 mg total) by mouth daily. 07/05/12  Yes Gwenlyn Found Copland, MD  amoxicillin (AMOXIL) 875 MG tablet Take 1 tablet (875 mg total) by mouth 2 (two) times daily. 08/22/12   Phillips Odor, MD  fluticasone (FLONASE) 50 MCG/ACT nasal spray Place 2 sprays into the nose daily. 11/14/11 11/13/12  Dois Davenport, MD  meloxicam (MOBIC) 7.5 MG tablet Take 1-2 tablets (7.5-15 mg total) by mouth daily as needed for pain. 04/18/12 04/18/13  Morrell Riddle, PA-C  pseudoephedrine-guaifenesin (MUCINEX D) 60-600 MG per tablet Take 1 tablet by mouth every 12 (twelve) hours. 08/22/12 08/22/13  Phillips Odor, MD  sucralfate (CARAFATE) 1 G tablet Take 1 tablet (1 g total) by mouth 4 (four) times daily. 07/05/12   Gwenlyn Found Copland, MD     ROS: The patient denies fevers, chills, night sweats, unintentional weight  loss, chest pain, palpitations, wheezing, dyspnea on exertion, nausea, vomiting, abdominal pain,  hematuria, melena, numbness, weakness, or tingling.   All other systems have been reviewed and were otherwise negative with the exception of those mentioned in the HPI and as above.    PHYSICAL EXAM: Filed Vitals:   10/18/12 1608  BP: 118/83  Pulse: 89  Temp: 98.1 F (36.7 C)  Resp: 16   Filed Vitals:   10/18/12 1608  Height: 5' 2.5" (1.588 m)  Weight: 182 lb (82.555 kg)   Body mass index is 32.76 kg/(m^2).  General: Alert, no acute distress HEENT:  Normocephalic, atraumatic, oropharynx patent. TM nl, no sinus tenderness, OP slight  erythema, no exudates Cardiovascular:  Regular rate and rhythm, no rubs murmurs or gallops.  No Carotid bruits, radial pulse intact. No pedal edema.  Respiratory: Clear to auscultation bilaterally.  No wheezes, rales, or rhonchi.  No cyanosis, no use of accessory musculature GI: No organomegaly, abdomen is soft and non-tender, positive bowel sounds.  No masses. Skin: No rashes. Neurologic: Facial musculature symmetric. Psychiatric: Patient is appropriate throughout our interaction. Lymphatic: No cervical lymphadenopathy Musculoskeletal: Gait intact. GU-+ thick cottage cheese like dc, no masses, lesions, rash   LABS: Results for orders placed in visit on 10/18/12  POCT UA - MICROSCOPIC ONLY      Component Value Range   WBC, Ur, HPF, POC 0-1     RBC, urine, microscopic 0-2     Bacteria, U Microscopic 1+     Mucus, UA trace     Epithelial cells, urine per micros 2-6     Crystals, Ur, HPF, POC neg     Casts, Ur, LPF, POC neg     Yeast, UA neg     Renal tubular cells pos    POCT URINALYSIS DIPSTICK      Component Value Range   Color, UA yellow     Clarity, UA cloudy     Glucose, UA neg     Bilirubin, UA neg     Ketones, UA neg     Spec Grav, UA 1.015     Blood, UA neg     pH, UA 5.5     Protein, UA neg     Urobilinogen, UA 0.2     Nitrite, UA neg     Leukocytes, UA Negative    POCT WET PREP WITH KOH      Component Value Range   Trichomonas, UA Negative     Clue Cells Wet Prep HPF POC 3-14     Epithelial Wet Prep HPF POC 2-16     Yeast Wet Prep HPF POC pos     Bacteria Wet Prep HPF POC 3+     RBC Wet Prep HPF POC 0-4     WBC Wet Prep HPF POC 2-8     KOH Prep POC Positive       EKG/XRAY:   Primary read interpreted by Dr. Conley Rolls at [facility].   ASSESSMENT/PLAN: Encounter Diagnoses  Name Primary?  . Vaginal discharge Yes  . Dysuria   . Bacterial vaginosis   . Candidiasis    Pleasant 32 y/o G0 caucasian female with a slight learning disability (you have to talk slowly so  she can understand everything, otherwise repeat what you said. She finished HS through a lot of persistence per her proud mom who is present during the office visit) with bacterial vaginosis and a yeast infection.  Rx Diflucan #2 pills Rx Flagyl 500 mg BID x  7 days F/u prn     Yousuf Ager PHUONG, DO 10/18/2012 6:00 PM

## 2012-10-28 ENCOUNTER — Telehealth: Payer: Self-pay

## 2012-10-28 NOTE — Telephone Encounter (Signed)
Patient was seen for yeast infection and sinus infection, was rxd flagil for yeast infection but sinus infection could not be treated at the same time. She is wondering if we can send in something for sinus infection now since she is done with flagil and still has sinus infection.  Best [phone]

## 2012-10-30 MED ORDER — AMOXICILLIN-POT CLAVULANATE 875-125 MG PO TABS: 1.0000 | ORAL_TABLET | Freq: Two times a day (BID) | ORAL | Status: DC

## 2012-10-30 NOTE — Telephone Encounter (Signed)
Called patient to advise this was sent in for her.

## 2012-10-30 NOTE — Telephone Encounter (Signed)
Augmentin sent to pharmacy. Needs to follow up if symptoms persist after completion of course.

## 2012-11-02 ENCOUNTER — Other Ambulatory Visit: Payer: Self-pay | Admitting: Family Medicine

## 2012-11-02 ENCOUNTER — Other Ambulatory Visit: Payer: Self-pay | Admitting: Physician Assistant

## 2012-11-18 ENCOUNTER — Other Ambulatory Visit: Payer: Self-pay

## 2013-01-20 ENCOUNTER — Ambulatory Visit (INDEPENDENT_AMBULATORY_CARE_PROVIDER_SITE_OTHER): Payer: BC Managed Care – PPO | Admitting: Family Medicine

## 2013-01-20 ENCOUNTER — Ambulatory Visit: Payer: BC Managed Care – PPO

## 2013-01-20 VITALS — BP 123/87 | HR 88 | Temp 97.9°F | Resp 16 | Ht 63.0 in | Wt 180.6 lb

## 2013-01-20 DIAGNOSIS — J45909 Unspecified asthma, uncomplicated: Secondary | ICD-10-CM | POA: Insufficient documentation

## 2013-01-20 DIAGNOSIS — J019 Acute sinusitis, unspecified: Secondary | ICD-10-CM

## 2013-01-20 DIAGNOSIS — E039 Hypothyroidism, unspecified: Secondary | ICD-10-CM | POA: Insufficient documentation

## 2013-01-20 DIAGNOSIS — K219 Gastro-esophageal reflux disease without esophagitis: Secondary | ICD-10-CM

## 2013-01-20 DIAGNOSIS — I10 Essential (primary) hypertension: Secondary | ICD-10-CM

## 2013-01-20 MED ORDER — PREDNISONE 20 MG PO TABS: ORAL_TABLET | ORAL | Status: DC

## 2013-01-20 MED ORDER — LISINOPRIL 10 MG PO TABS: 10.0000 mg | ORAL_TABLET | Freq: Every day | ORAL | Status: DC

## 2013-01-20 MED ORDER — MONTELUKAST SODIUM 10 MG PO TABS: 10.0000 mg | ORAL_TABLET | Freq: Every day | ORAL | Status: DC

## 2013-01-20 MED ORDER — OMEPRAZOLE 40 MG PO CPDR: 40.0000 mg | DELAYED_RELEASE_CAPSULE | Freq: Every day | ORAL | Status: DC

## 2013-01-20 MED ORDER — FLUTICASONE PROPIONATE 50 MCG/ACT NA SUSP: 2.0000 | Freq: Every day | NASAL | Status: DC

## 2013-01-20 MED ORDER — LEVOTHYROXINE SODIUM 100 MCG PO TABS: 100.0000 ug | ORAL_TABLET | Freq: Every day | ORAL | Status: DC

## 2013-01-20 MED ORDER — AMOXICILLIN 875 MG PO TABS: 875.0000 mg | ORAL_TABLET | Freq: Two times a day (BID) | ORAL | Status: DC

## 2013-01-20 NOTE — Progress Notes (Signed)
Subjective

## 2013-01-20 NOTE — Patient Instructions (Addendum)
Continue your current medications  Take amoxicillin one twice daily for infection  Take the prednisone in a tapered dose fashion as directed, 3 daily for the first 2 days, then 2 daily for 2 days, then one daily for 2 days.  Use the Flonase 2 sprays each nostril twice daily for 3 days, then once daily

## 2013-01-22 ENCOUNTER — Other Ambulatory Visit: Payer: Self-pay | Admitting: Physician Assistant

## 2013-01-22 ENCOUNTER — Encounter: Payer: Self-pay | Admitting: Physician Assistant

## 2013-01-22 MED ORDER — LEVOTHYROXINE SODIUM 125 MCG PO TABS: 125.0000 ug | ORAL_TABLET | Freq: Every day | ORAL | Status: DC

## 2013-03-26 ENCOUNTER — Other Ambulatory Visit: Payer: Self-pay | Admitting: Physician Assistant

## 2013-04-28 ENCOUNTER — Other Ambulatory Visit: Payer: Self-pay | Admitting: Physician Assistant

## 2013-05-04 DIAGNOSIS — Z0271 Encounter for disability determination: Secondary | ICD-10-CM

## 2013-05-17 ENCOUNTER — Ambulatory Visit (INDEPENDENT_AMBULATORY_CARE_PROVIDER_SITE_OTHER): Payer: BC Managed Care – PPO | Admitting: Family Medicine

## 2013-05-17 VITALS — BP 122/70 | HR 82 | Temp 97.8°F | Resp 18 | Ht 62.5 in | Wt 177.0 lb

## 2013-05-17 DIAGNOSIS — B379 Candidiasis, unspecified: Secondary | ICD-10-CM

## 2013-05-17 DIAGNOSIS — N760 Acute vaginitis: Secondary | ICD-10-CM

## 2013-05-17 DIAGNOSIS — Z124 Encounter for screening for malignant neoplasm of cervix: Secondary | ICD-10-CM

## 2013-05-17 DIAGNOSIS — E039 Hypothyroidism, unspecified: Secondary | ICD-10-CM

## 2013-05-17 DIAGNOSIS — N898 Other specified noninflammatory disorders of vagina: Secondary | ICD-10-CM

## 2013-05-17 DIAGNOSIS — B9689 Other specified bacterial agents as the cause of diseases classified elsewhere: Secondary | ICD-10-CM

## 2013-05-17 LAB — POCT CBC
Lymph, poc: 2.6 (ref 0.6–3.4)
MCH, POC: 28.2 pg (ref 27–31.2)
MCHC: 31.5 g/dL — AB (ref 31.8–35.4)
MID (cbc): 0.8 (ref 0–0.9)
MPV: 11.1 fL (ref 0–99.8)
POC MID %: 7.5 %M (ref 0–12)
Platelet Count, POC: 302 10*3/uL (ref 142–424)
WBC: 10.4 10*3/uL — AB (ref 4.6–10.2)

## 2013-05-17 LAB — POCT WET PREP WITH KOH
KOH Prep POC: POSITIVE
Yeast Wet Prep HPF POC: POSITIVE

## 2013-05-17 LAB — COMPREHENSIVE METABOLIC PANEL
AST: 21 U/L (ref 0–37)
Alkaline Phosphatase: 69 U/L (ref 39–117)
BUN: 8 mg/dL (ref 6–23)
Creat: 0.73 mg/dL (ref 0.50–1.10)
Total Bilirubin: 0.3 mg/dL (ref 0.3–1.2)

## 2013-05-17 MED ORDER — LEVOTHYROXINE SODIUM 125 MCG PO TABS: 125.0000 ug | ORAL_TABLET | Freq: Every day | ORAL | Status: DC

## 2013-05-17 MED ORDER — METRONIDAZOLE 500 MG PO TABS: 500.0000 mg | ORAL_TABLET | Freq: Two times a day (BID) | ORAL | Status: DC

## 2013-05-17 MED ORDER — FLUCONAZOLE 150 MG PO TABS: 150.0000 mg | ORAL_TABLET | Freq: Once | ORAL | Status: DC

## 2013-05-17 NOTE — Progress Notes (Addendum)
[facility] [address], [place] [address] [phone]  Date:  05/17/2013   Name:  Julia [name]   DOB:  [birth year]   MRN:  [id number]  PCP:  Abbe Amsterdam, MD    Chief Complaint: rx refills and Vaginitis   History of Present Illness:  Julia [name] is a 32 y.o. very pleasant female patient who presents with the following:  She is concerned that she may have a yeast infection.  She has noted some vaginal discomfort about about one day.  She has noted some vaginal discharge and itching.  No dysuria, no abdominal pain. Just had her last period.  She is not sexually active- never has been.    Last TSH was in October of last year- it was high and we increased her synthroid dosage slightly from 100 to 125 mcg.  Dickie is a very pleasant lady who has a mental handicap- she is high functioning but does depend on her mother to make most of her decisions. Her mother is here with her today.      Patient Active Problem List   Diagnosis Date Noted  . Hypothyroidism 01/20/2013  . Asthma 01/20/2013  . Mental handicap 07/05/2012  . Asthma 11/14/2011  . HTN (hypertension) 11/14/2011  . Hypothyroid 11/14/2011    Past Medical History  Diagnosis Date  . Allergy   . Asthma   . Hyperlipidemia   . Hypertension   . Ulcer     Past Surgical History  Procedure Laterality Date  . Appendectomy      History  Substance Use Topics  . Smoking status: Never Smoker   . Smokeless tobacco: Not on file  . Alcohol Use: No    Family History  Problem Relation Age of Onset  . Hypertension Mother   . Hypertension Father   . Hypertension Brother   . Asthma Maternal Grandmother   . Hypertension Maternal Grandfather   . Arthritis Paternal Grandmother   . COPD Paternal Grandfather     Allergies  Allergen Reactions  . Azithromycin Anaphylaxis    z-pac  . Latex Shortness Of Breath  . Sulfa Antibiotics Shortness Of Breath and Nausea And Vomiting     Medication list has been reviewed and updated.  Current Outpatient Prescriptions on File Prior to Visit  Medication Sig Dispense Refill  . fluticasone (FLONASE) 50 MCG/ACT nasal spray Place 2 sprays into the nose daily.  1 g  1  . ibuprofen (ADVIL,MOTRIN) 200 MG tablet Take 600 mg by mouth every 6 (six) hours as needed.      Marland Kitchen levothyroxine (SYNTHROID, LEVOTHROID) 125 MCG tablet Take 1 tablet (125 mcg total) by mouth daily before breakfast. Needs office visit/labs  15 tablet  0  . lisinopril (PRINIVIL,ZESTRIL) 10 MG tablet Take 1 tablet (10 mg total) by mouth daily.  90 tablet  3  . montelukast (SINGULAIR) 10 MG tablet Take 1 tablet (10 mg total) by mouth at bedtime.  90 tablet  3  . omeprazole (PRILOSEC) 40 MG capsule Take 1 capsule (40 mg total) by mouth daily.  90 capsule  3  . amoxicillin (AMOXIL) 875 MG tablet Take 1 tablet (875 mg total) by mouth 2 (two) times daily.  20 tablet  0  . amoxicillin (AMOXIL) 875 MG tablet Take 1 tablet (875 mg total) by mouth 2 (two) times daily.  20 tablet  0  . amoxicillin-clavulanate (AUGMENTIN) 875-125 MG per tablet Take 1 tablet by mouth 2 (two) times daily.  20 tablet  0  . fluconazole (DIFLUCAN) 150 MG tablet Take 1 tablet (150 mg total) by mouth once.  2 tablet  0  . levonorgestrel-ethinyl estradiol (AVIANE,ALESSE,LESSINA) 0.1-20 MG-MCG tablet Take 1 tablet by mouth daily.  1 Package  11  . levothyroxine (SYNTHROID, LEVOTHROID) 100 MCG tablet Take 1 tablet (100 mcg total) by mouth daily.  30 tablet  6  . metroNIDAZOLE (FLAGYL) 500 MG tablet Take 1 tablet (500 mg total) by mouth 2 (two) times daily.  14 tablet  0  . predniSONE (DELTASONE) 20 MG tablet Take 3 daily for 2 days, then 2 daily for 2 days, then one daily for 2 days  12 tablet  0  . pseudoephedrine-guaifenesin (MUCINEX D) 60-600 MG per tablet Take 1 tablet by mouth every 12 (twelve) hours.  18 tablet  0   No current facility-administered medications on file prior to visit.    Review of  Systems:  As per HPI- otherwise negative. Last pap was about 3 years ago.  She is not SA, but explained that there are not guidelines regarding pap frequency in non- sexually active women.  She would be ok with having a pap done today  Physical Examination: Filed Vitals:   05/17/13 1433  BP: 122/70  Pulse: 82  Temp: 97.8 F (36.6 C)  Resp: 18   Filed Vitals:   05/17/13 1433  Height: 5' 2.5" (1.588 m)  Weight: 177 lb (80.287 kg)   Body mass index is 31.84 kg/(m^2). Ideal Body Weight: Weight in (lb) to have BMI = 25: 138.6  GEN: WDWN, NAD, Non-toxic, A & O x 3, overweight HEENT: Atraumatic, Normocephalic. Neck supple. No masses, No LAD. Ears and Nose: No external deformity. CV: RRR, No M/G/R. No JVD. No thrill. No extra heart sounds. PULM: CTA B, no wheezes, crackles, rhonchi. No retractions. No resp. distress. No accessory muscle use. ABD: S, NT, ND, +BS. No rebound. No HSM. EXTR: No c/c/e NEURO Normal gait.  PSYCH: Normally interactive. Conversant. Not depressed or anxious appearing.  Calm demeanor.  Gu: normal exam except for some mild thick discharge.  Performed gentle speculum exam and pap.  No CMT, no adnexal tenderness or masses  Results for orders placed in visit on 05/17/13  POCT CBC      Result Value Range   WBC 10.4 (*) 4.6 - 10.2 K/uL   Lymph, poc 2.6  0.6 - 3.4   POC LYMPH PERCENT 25.1  10 - 50 %L   MID (cbc) 0.8  0 - 0.9   POC MID % 7.5  0 - 12 %M   POC Granulocyte 7.0 (*) 2 - 6.9   Granulocyte percent 67.4  37 - 80 %G   RBC 4.58  4.04 - 5.48 M/uL   Hemoglobin 12.9  12.2 - 16.2 g/dL   HCT, POC 16.1  09.6 - 47.9 %   MCV 89.3  80 - 97 fL   MCH, POC 28.2  27 - 31.2 pg   MCHC 31.5 (*) 31.8 - 35.4 g/dL   RDW, POC 04.5     Platelet Count, POC 302  142 - 424 K/uL   MPV 11.1  0 - 99.8 fL  GLUCOSE, POCT (MANUAL RESULT ENTRY)      Result Value Range   POC Glucose 83  70 - 99 mg/dl  POCT WET PREP WITH KOH      Result Value Range   Trichomonas, UA Negative      Clue Cells Wet Prep HPF  POC tntc     Epithelial Wet Prep HPF POC neg     Yeast Wet Prep HPF POC pos     Bacteria Wet Prep HPF POC 4+     RBC Wet Prep HPF POC 4-6     WBC Wet Prep HPF POC 2-8     KOH Prep POC Positive       Assessment and Plan: Vaginal discharge - Plan: POCT Wet Prep with KOH, fluconazole (DIFLUCAN) 150 MG tablet  Cervical cancer screening - Plan: Pap IG, CT/NG w/ reflex HPV when ASC-U  Unspecified hypothyroidism - Plan: POCT CBC, POCT glucose (manual entry), Comprehensive metabolic panel, TSH, levothyroxine (SYNTHROID, LEVOTHROID) 125 MCG tablet  Candidiasis - Plan: fluconazole (DIFLUCAN) 150 MG tablet  Bacterial vaginosis - Plan: metroNIDAZOLE (FLAGYL) 500 MG tablet  BV and yeast vaginitis.  Treat with flagyl and diflucan.   Await TSH Pap pending.    Signed Abbe Amsterdam, MD  addnd 8/16- received the rest of her labs.  Pap is normal.  Her TSH is low.  Called both numbers but no answer and could not LM.  Will try back Called again- was able to [facility] Will decrease her synthroid slightly as her TSH is slightly low.  Otherwise labs ok, will send a copy of her report.    Results for orders placed in visit on 05/17/13  COMPREHENSIVE METABOLIC PANEL      Result Value Range   Sodium 135  135 - 145 mEq/L   Potassium 4.3  3.5 - 5.3 mEq/L   Chloride 101  96 - 112 mEq/L   CO2 25  19 - 32 mEq/L   Glucose, Bld 87  70 - 99 mg/dL   BUN 8  6 - 23 mg/dL   Creat 1.61  0.96 - 0.45 mg/dL   Total Bilirubin 0.3  0.3 - 1.2 mg/dL   Alkaline Phosphatase 69  39 - 117 U/L   AST 21  0 - 37 U/L   ALT 20  0 - 35 U/L   Total Protein 7.7  6.0 - 8.3 g/dL   Albumin 4.1  3.5 - 5.2 g/dL   Calcium 9.6  8.4 - 40.9 mg/dL  TSH      Result Value Range   TSH 0.118 (*) 0.350 - 4.500 uIU/mL  POCT CBC      Result Value Range   WBC 10.4 (*) 4.6 - 10.2 K/uL   Lymph, poc 2.6  0.6 - 3.4   POC LYMPH PERCENT 25.1  10 - 50 %L   MID (cbc) 0.8  0 - 0.9   POC MID % 7.5  0 - 12 %M   POC  Granulocyte 7.0 (*) 2 - 6.9   Granulocyte percent 67.4  37 - 80 %G   RBC 4.58  4.04 - 5.48 M/uL   Hemoglobin 12.9  12.2 - 16.2 g/dL   HCT, POC 81.1  91.4 - 47.9 %   MCV 89.3  80 - 97 fL   MCH, POC 28.2  27 - 31.2 pg   MCHC 31.5 (*) 31.8 - 35.4 g/dL   RDW, POC 78.2     Platelet Count, POC 302  142 - 424 K/uL   MPV 11.1  0 - 99.8 fL  GLUCOSE, POCT (MANUAL RESULT ENTRY)      Result Value Range   POC Glucose 83  70 - 99 mg/dl  POCT WET PREP WITH KOH      Result Value Range   Trichomonas, UA Negative  Clue Cells Wet Prep HPF POC tntc     Epithelial Wet Prep HPF POC neg     Yeast Wet Prep HPF POC pos     Bacteria Wet Prep HPF POC 4+     RBC Wet Prep HPF POC 4-6     WBC Wet Prep HPF POC 2-8     KOH Prep POC Positive    PAP IG, CT-NG, RFX HPV ASCU      Result Value Range   Specimen adequacy:       FINAL DIAGNOSIS:       COMMENTS:       Cytotechnologist:       Chlamydia Probe Amp NEGATIVE     GC Probe Amp NEGATIVE

## 2013-05-17 NOTE — Patient Instructions (Addendum)
Let me know if you are not feeling better in the next few days.   I will be in touch with your pap and other lab results.   Leave the thyroid medication at the pharmacy until I contact you For the bacterial vaginosis, take flagyl twice a day for one week Take diflucan for yeast infection- take one pill, repeat in a week if needed

## 2013-05-19 LAB — PAP IG, CT-NG, RFX HPV ASCU
Chlamydia Probe Amp: NEGATIVE
GC Probe Amp: NEGATIVE

## 2013-05-22 MED ORDER — LEVOTHYROXINE SODIUM 100 MCG PO TABS: 100.0000 ug | ORAL_TABLET | Freq: Every day | ORAL | Status: DC

## 2013-05-22 NOTE — Addendum Note (Signed)
Addended by: Abbe Amsterdam C on: 05/22/2013 07:20 PM   Modules accepted: Orders

## 2013-05-22 NOTE — Addendum Note (Signed)
Addended by: Abbe Amsterdam C on: 05/22/2013 07:18 PM   Modules accepted: Orders

## 2013-05-24 ENCOUNTER — Telehealth: Payer: Self-pay

## 2013-05-24 NOTE — Telephone Encounter (Signed)
Patient's mom has questions for Dr Patsy Lager  In regards to her daughter. Please call Jan @ [phone]

## 2013-05-25 NOTE — Telephone Encounter (Signed)
Spoke with mom. Says pt took first Diflucan with abx and second diflucan after finishing abx but is still having discharge, although it's not as bad. What's concerning mom however is that pt is feverish at times and is very fatigued. Pt wanted me to talk to you and get your advise before coming back in because it's often hard to bring her in to our clinic with our wait times. Thanks

## 2013-05-25 NOTE — Telephone Encounter (Signed)
Called her mother back.  She finished her flagyl Thursday, and took her 2nd diflucan yesterday.   She also is sore in her vaginal area.   Yesterday she seemed fatigue, and was sweating.  His temperature has been normal.   explained that I am not as concerned that her discharge is not gone yet, as this may take some time to fully clear.  However, if she is otherwise ill or having any abdominal pain she should come in for a recheck.  She states she does not have any abdominal pain now.  Mother took this under advisement and will let me know if any other concerns.

## 2013-05-28 ENCOUNTER — Ambulatory Visit (HOSPITAL_COMMUNITY)
Admission: RE | Admit: 2013-05-28 | Discharge: 2013-05-28 | Disposition: A | Discharge status: Home / Self Care | Payer: BC Managed Care – PPO | Source: Ambulatory Visit | Attending: Family Medicine | Admitting: Family Medicine

## 2013-05-28 ENCOUNTER — Encounter (HOSPITAL_COMMUNITY): Payer: Self-pay

## 2013-05-28 ENCOUNTER — Ambulatory Visit (INDEPENDENT_AMBULATORY_CARE_PROVIDER_SITE_OTHER): Payer: BC Managed Care – PPO | Admitting: Family Medicine

## 2013-05-28 VITALS — BP 110/70 | HR 90 | Temp 98.0°F | Resp 18 | Ht 63.0 in | Wt 177.4 lb

## 2013-05-28 DIAGNOSIS — R509 Fever, unspecified: Secondary | ICD-10-CM | POA: Insufficient documentation

## 2013-05-28 DIAGNOSIS — N898 Other specified noninflammatory disorders of vagina: Secondary | ICD-10-CM

## 2013-05-28 DIAGNOSIS — D72829 Elevated white blood cell count, unspecified: Secondary | ICD-10-CM | POA: Insufficient documentation

## 2013-05-28 DIAGNOSIS — R109 Unspecified abdominal pain: Secondary | ICD-10-CM

## 2013-05-28 DIAGNOSIS — R3129 Other microscopic hematuria: Secondary | ICD-10-CM

## 2013-05-28 DIAGNOSIS — N899 Noninflammatory disorder of vagina, unspecified: Secondary | ICD-10-CM

## 2013-05-28 LAB — POCT URINALYSIS DIPSTICK
Ketones, UA: NEGATIVE
Protein, UA: NEGATIVE
Spec Grav, UA: 1.025
Urobilinogen, UA: 0.2
pH, UA: 5.5

## 2013-05-28 LAB — POCT CBC
Granulocyte percent: 72.5 %G (ref 37–80)
HCT, POC: 40.4 % (ref 37.7–47.9)
Lymph, poc: 2.6 (ref 0.6–3.4)
MCH, POC: 28.6 pg (ref 27–31.2)
MCV: 88.7 fL (ref 80–97)
MID (cbc): 0.8 (ref 0–0.9)
POC LYMPH PERCENT: 21.3 %L (ref 10–50)
Platelet Count, POC: 288 10*3/uL (ref 142–424)
RDW, POC: 14.1 %
WBC: 12.1 10*3/uL — AB (ref 4.6–10.2)

## 2013-05-28 LAB — POCT UA - MICROSCOPIC ONLY
Casts, Ur, LPF, POC: NEGATIVE
Crystals, Ur, HPF, POC: NEGATIVE

## 2013-05-28 LAB — POCT WET PREP WITH KOH
KOH Prep POC: NEGATIVE
Yeast Wet Prep HPF POC: NEGATIVE

## 2013-05-28 MED ORDER — IOHEXOL 300 MG/ML  SOLN
50.0000 mL | Freq: Once | INTRAMUSCULAR | Status: AC | PRN
  Administered 2013-05-28: 50 mL via ORAL

## 2013-05-28 MED ORDER — NITROFURANTOIN MONOHYD MACRO 100 MG PO CAPS: 100.0000 mg | ORAL_CAPSULE | Freq: Two times a day (BID) | ORAL | Status: DC

## 2013-05-28 MED ORDER — IOHEXOL 300 MG/ML  SOLN
100.0000 mL | Freq: Once | INTRAMUSCULAR | Status: AC | PRN
  Administered 2013-05-28: 100 mL via INTRAVENOUS

## 2013-05-28 NOTE — Patient Instructions (Addendum)
You may go to [facility] today/now for the CT scan    Driving directions to [facility] [id number]  [phone]  - more info    [address]  [place], [place] [address]     1. Head north on [address] toward [address]      [address]    2. Turn right onto [address]      0.3 mi    3. Slight left to stay on [address]      1.7 mi    4. Turn left onto [address]  Destination will be on the right     0.6 mi     [facility]  [address] [place]

## 2013-05-28 NOTE — Progress Notes (Addendum)
[facility] [address] [place] [address] [phone]  Date:  05/28/2013   Name:  Julia [name]   DOB:  08-18-1981   MRN:  [id number]  PCP:  Abbe Amsterdam, MD    Chief Complaint: Vaginal Discharge   History of Present Illness:  Julia [name] is a 32 y.o. very pleasant female patient who presents with the following:  Arleny was here on 8/14 with complaint of vaginal discharge. She was noted to have BV and a yeast infection.  She was treated with diflucan and flagyl po.  She also had a normal pap smear (she is not sexually active but is in her 30's).    She had felt better after treatment, but then her sx seemed to return.  She again has vaginal discharge and "burning" over the last couple of days.  She thinks the burning is coming from her genitals, but she also has abdominal discomfort.   She has been eating ok.  Her mother notes that she has seemed tired and has not had her normal energy. She does note some dysuria, but is not sure if this is due to urine touching inflamed tissues.    Recent normal BMP- glucose ok.    No cough or ST.  No fever noted.  She has been eating normally.  She is s/p appendectomy.    LMP 05/07/13.    Patient Active Problem List   Diagnosis Date Noted  . Hypothyroidism 01/20/2013  . Asthma 01/20/2013  . Mental handicap 07/05/2012  . Asthma 11/14/2011  . HTN (hypertension) 11/14/2011  . Hypothyroid 11/14/2011    Past Medical History  Diagnosis Date  . Allergy   . Asthma   . Hyperlipidemia   . Hypertension   . Ulcer     Past Surgical History  Procedure Laterality Date  . Appendectomy      History  Substance Use Topics  . Smoking status: Never Smoker   . Smokeless tobacco: Not on file  . Alcohol Use: No    Family History  Problem Relation Age of Onset  . Hypertension Mother   . Hypertension Father   . Hypertension Brother   . Asthma Maternal Grandmother   . Hypertension Maternal  Grandfather   . Arthritis Paternal Grandmother   . COPD Paternal Grandfather     Allergies  Allergen Reactions  . Azithromycin Anaphylaxis    z-pac  . Latex Shortness Of Breath  . Sulfa Antibiotics Shortness Of Breath and Nausea And Vomiting    Medication list has been reviewed and updated.  Current Outpatient Prescriptions on File Prior to Visit  Medication Sig Dispense Refill  . ibuprofen (ADVIL,MOTRIN) 200 MG tablet Take 600 mg by mouth every 6 (six) hours as needed.      Marland Kitchen levothyroxine (SYNTHROID, LEVOTHROID) 100 MCG tablet Take 1 tablet (100 mcg total) by mouth daily before breakfast.  90 tablet  2  . lisinopril (PRINIVIL,ZESTRIL) 10 MG tablet Take 1 tablet (10 mg total) by mouth daily.  90 tablet  3  . montelukast (SINGULAIR) 10 MG tablet Take 1 tablet (10 mg total) by mouth at bedtime.  90 tablet  3  . omeprazole (PRILOSEC) 40 MG capsule Take 1 capsule (40 mg total) by mouth daily.  90 capsule  3  . fluconazole (DIFLUCAN) 150 MG tablet Take 1 tablet (150 mg total) by mouth once.  2 tablet  0  . fluticasone (FLONASE) 50 MCG/ACT nasal spray Place 2 sprays into the  nose daily.  1 g  1  . metroNIDAZOLE (FLAGYL) 500 MG tablet Take 1 tablet (500 mg total) by mouth 2 (two) times daily.  14 tablet  0  . predniSONE (DELTASONE) 20 MG tablet Take 3 daily for 2 days, then 2 daily for 2 days, then one daily for 2 days  12 tablet  0  . pseudoephedrine-guaifenesin (MUCINEX D) 60-600 MG per tablet Take 1 tablet by mouth every 12 (twelve) hours.  18 tablet  0   No current facility-administered medications on file prior to visit.    Review of Systems:  As per HPI- otherwise negative.   Physical Examination: Filed Vitals:   05/28/13 1611  BP: 110/70  Pulse: 90  Temp: 98 F (36.7 C)  Resp: 18   Filed Vitals:   05/28/13 1611  Height: 5\' 3"  (1.6 m)  Weight: 177 lb 6.4 oz (80.468 kg)   Body mass index is 31.43 kg/(m^2). Ideal Body Weight: Weight in (lb) to have BMI = 25:  140.8  GEN: WDWN, NAD, Non-toxic, A & O x 3, overweight, looks well HEENT: Atraumatic, Normocephalic. Neck supple. No masses, No LAD.  Bilateral TM wnl, oropharynx normal.  PEERL,EOMI.   Ears and Nose: No external deformity. CV: RRR, No M/G/R. No JVD. No thrill. No extra heart sounds. PULM: CTA B, no wheezes, crackles, rhonchi. No retractions. No resp. distress. No accessory muscle use. ABD: S, ND, +BS. No rebound. No HSM.  She has abdominal tenderness that is not- consistent and which is difficult to pin- point.  Most tender over her lower abdomen. Gu: normal external exam, no inflammation noted.  Normal speculum exam; does have some dark blood in vaginal vault.  No CMT or adnexal masses or tenderness.  EXTR: No c/c/e NEURO Normal gait.  PSYCH: Normally interactive. Conversant. Not depressed or anxious appearing.  Calm demeanor.   Results for orders placed in visit on 05/28/13  POCT CBC      Result Value Range   WBC 12.1 (*) 4.6 - 10.2 K/uL   Lymph, poc 2.6  0.6 - 3.4   POC LYMPH PERCENT 21.3  10 - 50 %L   MID (cbc) 0.8  0 - 0.9   POC MID % 6.2  0 - 12 %M   POC Granulocyte 8.8 (*) 2 - 6.9   Granulocyte percent 72.5  37 - 80 %G   RBC 4.55  4.04 - 5.48 M/uL   Hemoglobin 13.0  12.2 - 16.2 g/dL   HCT, POC 16.1  09.6 - 47.9 %   MCV 88.7  80 - 97 fL   MCH, POC 28.6  27 - 31.2 pg   MCHC 32.2  31.8 - 35.4 g/dL   RDW, POC 04.5     Platelet Count, POC 288  142 - 424 K/uL   MPV 11.8  0 - 99.8 fL  POCT UA - MICROSCOPIC ONLY      Result Value Range   WBC, Ur, HPF, POC 0-1     RBC, urine, microscopic 1-4     Bacteria, U Microscopic trace     Mucus, UA mod     Epithelial cells, urine per micros 0-2     Crystals, Ur, HPF, POC neg     Casts, Ur, LPF, POC neg     Yeast, UA neg    POCT URINALYSIS DIPSTICK      Result Value Range   Color, UA yellow     Clarity, UA clear  Glucose, UA neg     Bilirubin, UA neg     Ketones, UA neg     Spec Grav, UA 1.025     Blood, UA trace-lysed      pH, UA 5.5     Protein, UA neg     Urobilinogen, UA 0.2     Nitrite, UA neg     Leukocytes, UA Negative    POCT WET PREP WITH KOH      Result Value Range   Trichomonas, UA Negative     Clue Cells Wet Prep HPF POC neg     Epithelial Wet Prep HPF POC 0-5     Yeast Wet Prep HPF POC neg     Bacteria Wet Prep HPF POC trace     RBC Wet Prep HPF POC neg     WBC Wet Prep HPF POC 0-2     KOH Prep POC Negative    POCT URINE PREGNANCY      Result Value Range   Preg Test, Ur Negative      Assessment and Plan: Vaginal irritation - Plan: POCT CBC, POCT UA - Microscopic Only, POCT urinalysis dipstick, POCT Wet Prep with KOH, Urine culture  Abdominal  pain, other specified site - Plan: CT Abdomen Pelvis W Contrast, POCT urine pregnancy  Cleota is here today with persistent abdominal pain.  It is somewhat difficult to ascertain the exact history.  However, her white blood cell count has gone up.  She may have diverticulitis.  Discussed doing a CT scan in detail with Amyia and her mother.  They would like to do a CT today.    Signed Abbe Amsterdam, MD  Received CT result- it is normal.  Will treat for a UTI while we await her urine culture.  They may also try azo for pain.   macrobid rx.  Will call and check on her tomorrow

## 2013-05-29 ENCOUNTER — Telehealth: Payer: Self-pay | Admitting: Family Medicine

## 2013-05-29 NOTE — Telephone Encounter (Signed)
Called to check on her today- spoke with her mom.  Julia [name] is doing a bit better.  Urine culture pending, I will call when this comes in

## 2013-05-30 ENCOUNTER — Encounter: Payer: Self-pay | Admitting: Family Medicine

## 2013-05-30 LAB — URINE CULTURE: Colony Count: NO GROWTH

## 2013-05-30 NOTE — Addendum Note (Signed)
Addended by: Abbe Amsterdam C on: 05/30/2013 01:32 PM   Modules accepted: Orders

## 2013-06-14 ENCOUNTER — Encounter: Payer: Self-pay | Admitting: Family Medicine

## 2013-06-14 ENCOUNTER — Ambulatory Visit (INDEPENDENT_AMBULATORY_CARE_PROVIDER_SITE_OTHER): Payer: BC Managed Care – PPO | Admitting: Family Medicine

## 2013-06-14 DIAGNOSIS — N760 Acute vaginitis: Secondary | ICD-10-CM

## 2013-06-14 LAB — POCT WET PREP WITH KOH
KOH Prep POC: POSITIVE
Trichomonas, UA: NEGATIVE
Yeast Wet Prep HPF POC: POSITIVE

## 2013-06-14 MED ORDER — FLUCONAZOLE 150 MG PO TABS: ORAL_TABLET | ORAL | Status: DC

## 2013-06-14 NOTE — Progress Notes (Signed)
Is a 32 year old woman who has never been sexually active. She comes in with a history of vaginal yeast infection one month ago. She took the Diflucan at that time the symptoms resolved. Over the last week the symptoms seem to come back with vaginal itching and minimal discharge.  Objective: Examination of the vulva revealed moderate erythema. Results for orders placed in visit on 06/14/13  POCT WET PREP WITH KOH      Result Value Range   Trichomonas, UA Negative     Clue Cells Wet Prep HPF POC 0-1     Epithelial Wet Prep HPF POC 2-4     Yeast Wet Prep HPF POC positive     Bacteria Wet Prep HPF POC 1+     RBC Wet Prep HPF POC 0-2     WBC Wet Prep HPF POC 2-4     KOH Prep POC Positive       Assessment: Vulvovaginitis  Plan: Diflucan 150 every week x4  Signed, Elvina Sidle

## 2013-06-14 NOTE — Patient Instructions (Addendum)
Monilial Vaginitis  Vaginitis in a soreness, swelling and redness (inflammation) of the vagina and vulva. Monilial vaginitis is not a sexually transmitted infection.  CAUSES   Yeast vaginitis is caused by yeast (candida) that is normally found in your vagina. With a yeast infection, the candida has overgrown in number to a point that upsets the chemical balance.  SYMPTOMS   · White, thick vaginal discharge.  · Swelling, itching, redness and irritation of the vagina and possibly the lips of the vagina (vulva).  · Burning or painful urination.  · Painful intercourse.  DIAGNOSIS   Things that may contribute to monilial vaginitis are:  · Postmenopausal and virginal states.  · Pregnancy.  · Infections.  · Being tired, sick or stressed, especially if you had monilial vaginitis in the past.  · Diabetes. Good control will help lower the chance.  · Birth control pills.  · Tight fitting garments.  · Using bubble bath, feminine sprays, douches or deodorant tampons.  · Taking certain medications that kill germs (antibiotics).  · Sporadic recurrence can occur if you become ill.  TREATMENT   Your caregiver will give you medication.  · There are several kinds of anti monilial vaginal creams and suppositories specific for monilial vaginitis. For recurrent yeast infections, use a suppository or cream in the vagina 2 times a week, or as directed.  · Anti-monilial or steroid cream for the itching or irritation of the vulva may also be used. Get your caregiver's permission.  · Painting the vagina with methylene blue solution may help if the monilial cream does not work.  · Eating yogurt may help prevent monilial vaginitis.  HOME CARE INSTRUCTIONS   · Finish all medication as prescribed.  · Do not have sex until treatment is completed or after your caregiver tells you it is okay.  · Take warm sitz baths.  · Do not douche.  · Do not use tampons, especially scented ones.  · Wear cotton underwear.  · Avoid tight pants and panty  hose.  · Tell your sexual partner that you have a yeast infection. They should go to their caregiver if they have symptoms such as mild rash or itching.  · Your sexual partner should be treated as well if your infection is difficult to eliminate.  · Practice safer sex. Use condoms.  · Some vaginal medications cause latex condoms to fail. Vaginal medications that harm condoms are:  · Cleocin cream.  · Butoconazole (Femstat®).  · Terconazole (Terazol®) vaginal suppository.  · Miconazole (Monistat®) (may be purchased over the counter).  SEEK MEDICAL CARE IF:   · You have a temperature by mouth above 102° F (38.9° C).  · The infection is getting worse after 2 days of treatment.  · The infection is not getting better after 3 days of treatment.  · You develop blisters in or around your vagina.  · You develop vaginal bleeding, and it is not your menstrual period.  · You have pain when you urinate.  · You develop intestinal problems.  · You have pain with sexual intercourse.  Document Released: 06/30/2005 Document Revised: 12/13/2011 Document Reviewed: 03/14/2009  ExitCare® Patient Information ©2014 [employer].

## 2013-07-08 ENCOUNTER — Telehealth: Payer: Self-pay

## 2013-07-08 MED ORDER — TERCONAZOLE 0.4 % VA CREA: 1.0000 | TOPICAL_CREAM | Freq: Every day | VAGINAL | Status: DC

## 2013-07-08 NOTE — Telephone Encounter (Signed)
Pt mom stated that pt is still having vaginal discharge.  She asked if we could call in something else.  She stated that when pt was seen by Dr. Hal Hope she called her in some cream.  The cream was Terazol 7.  I spoke with Herbert Seta and she stated that it was ok to send that in for her.  Rx sent to pharmacy

## 2013-07-08 NOTE — Telephone Encounter (Signed)
Patient's mom called needing call back to discuss daughter's on going problem with yeast infection.  Please call her back @ [phone]

## 2013-07-19 ENCOUNTER — Encounter: Payer: Self-pay | Admitting: *Deleted

## 2013-08-08 ENCOUNTER — Ambulatory Visit: Payer: BC Managed Care – PPO | Admitting: Gynecology

## 2014-01-17 ENCOUNTER — Other Ambulatory Visit: Payer: Self-pay | Admitting: Family Medicine

## 2014-03-05 ENCOUNTER — Ambulatory Visit (INDEPENDENT_AMBULATORY_CARE_PROVIDER_SITE_OTHER): Payer: BC Managed Care – PPO | Admitting: Emergency Medicine

## 2014-03-05 VITALS — BP 124/76 | HR 83 | Temp 98.0°F | Resp 16 | Ht 62.5 in | Wt 183.8 lb

## 2014-03-05 DIAGNOSIS — Z79899 Other long term (current) drug therapy: Secondary | ICD-10-CM

## 2014-03-05 DIAGNOSIS — J45909 Unspecified asthma, uncomplicated: Secondary | ICD-10-CM

## 2014-03-05 DIAGNOSIS — E039 Hypothyroidism, unspecified: Secondary | ICD-10-CM

## 2014-03-05 DIAGNOSIS — J309 Allergic rhinitis, unspecified: Secondary | ICD-10-CM

## 2014-03-05 MED ORDER — LEVOTHYROXINE SODIUM 100 MCG PO TABS: 100.0000 ug | ORAL_TABLET | Freq: Every day | ORAL | Status: DC

## 2014-03-05 MED ORDER — OMEPRAZOLE 40 MG PO CPDR: 40.0000 mg | DELAYED_RELEASE_CAPSULE | Freq: Every day | ORAL | Status: DC

## 2014-03-05 MED ORDER — MONTELUKAST SODIUM 10 MG PO TABS: 10.0000 mg | ORAL_TABLET | Freq: Every day | ORAL | Status: DC

## 2014-03-05 NOTE — Progress Notes (Signed)
   Subjective:    Patient ID: Julia [name], female    DOB: [birth year], 33 y.o.   MRN: [id number]  HPI 33 yo female with complaint of needing medication refills.  No recent illness or complaints.  She states that she has had no changes in health recently.  Tolerating current medicaitons without difficulty.  PPMH:  HTN, hypothyroid    Review of Systems As per HPI otherwise negative.     Objective:   Physical Exam Blood pressure 124/76, pulse 83, temperature 98 F (36.7 C), temperature source Oral, resp. rate 16, height 5' 2.5" (1.588 m), weight 183 lb 12.8 oz (83.371 kg), SpO2 100.00%. Body mass index is 33.06 kg/(m^2). Well-developed, well nourished female who is awake, alert and oriented, in NAD. HEENT: [place]/AT, PERRL, EOMI.  Sclera and conjunctiva are clear. OP is clear. Neck: supple, non-tender, no lymphadenopathy, thyromegaly. Heart: RRR, no murmur Lungs: normal effort, CTA Extremities: no cyanosis, clubbing or edema. Skin: warm and dry without rash. Psychologic: good mood and appropriate affect, normal speech and behavior.      Assessment & Plan:  Medication refill.  Will recheck TSH .

## 2014-03-06 LAB — TSH: TSH: 3.223 u[IU]/mL (ref 0.350–4.500)

## 2014-03-11 ENCOUNTER — Telehealth: Payer: Self-pay | Admitting: *Deleted

## 2014-03-11 NOTE — Telephone Encounter (Signed)
Pt called in regards to labs done earlier this month. Advised her of the results, she expressed understanding.

## 2014-04-08 ENCOUNTER — Other Ambulatory Visit: Payer: Self-pay | Admitting: Emergency Medicine

## 2014-04-09 ENCOUNTER — Telehealth: Payer: Self-pay | Admitting: *Deleted

## 2014-04-09 MED ORDER — LEVOTHYROXINE SODIUM 100 MCG PO TABS: 100.0000 ug | ORAL_TABLET | Freq: Every day | ORAL | Status: DC

## 2014-04-09 MED ORDER — OMEPRAZOLE 40 MG PO CPDR: 40.0000 mg | DELAYED_RELEASE_CAPSULE | Freq: Every day | ORAL | Status: DC

## 2014-04-09 NOTE — Addendum Note (Signed)
Addended by: Elease EtienneHANSEN, SARA A on: 04/09/2014 11:10 AM   Modules accepted: Orders

## 2014-04-09 NOTE — Telephone Encounter (Signed)
Pt needed refill on Synthroid also. Advised sent to the pharmacy.

## 2014-04-09 NOTE — Telephone Encounter (Signed)
PT NEEDS A REFILL ON PRILOSEC

## 2014-05-23 IMAGING — CT CT ABD-PELV W/ CM
2 of 4 series · 17 of 46 positions shown, 19 images · IV contrast (OMNIPAQUE)
Comparison: 06/22/2010

CLINICAL DATA: Elevated white blood cell count. Fever.

EXAM:
CT ABDOMEN AND PELVIS WITH CONTRAST
TECHNIQUE: Multidetector CT imaging of the abdomen and pelvis was performed
using the standard protocol following bolus administration of
intravenous contrast.
CONTRAST:  50mL OMNIPAQUE IOHEXOL 300 MG/ML SOLN, 100mL OMNIPAQUE
IOHEXOL 300 MG/ML SOLN

[Series 2: rtn a/p with · axial · 0.84mm/px · z∈[+1358,+1773]mm · 14 of 91 slices shown, 16 images]
[im 4/91  soft-tissue]
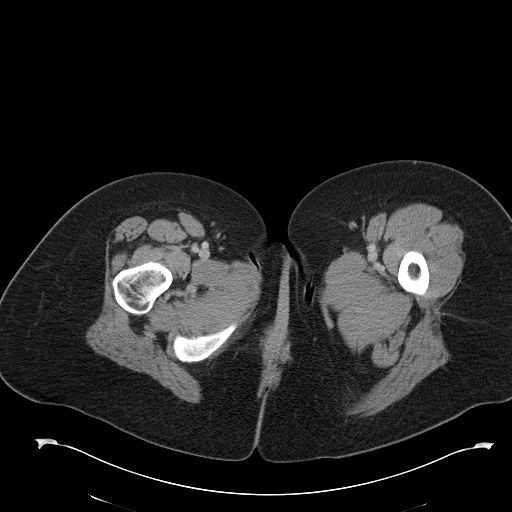
[im 4/91  bone]
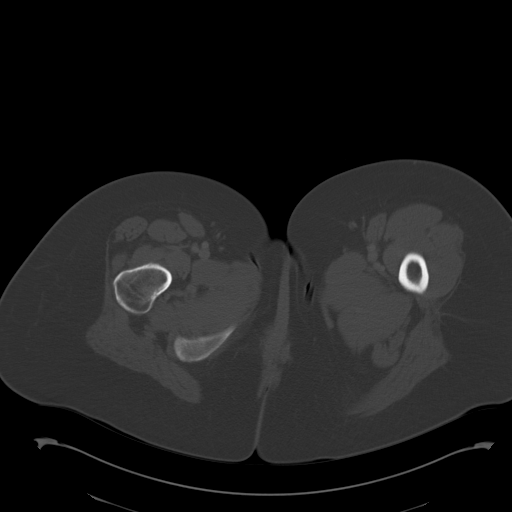
[im 12/91  soft-tissue]
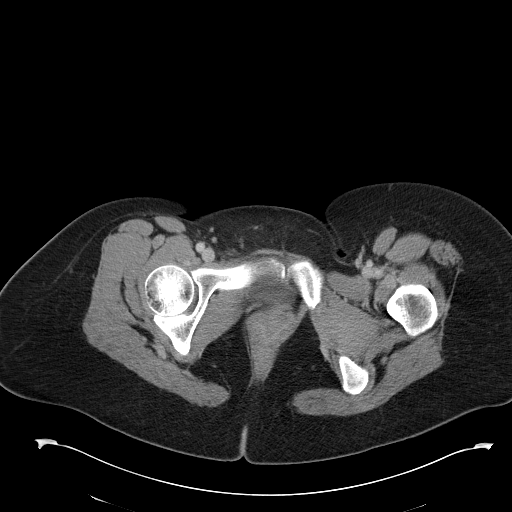
[im 16/91  soft-tissue]
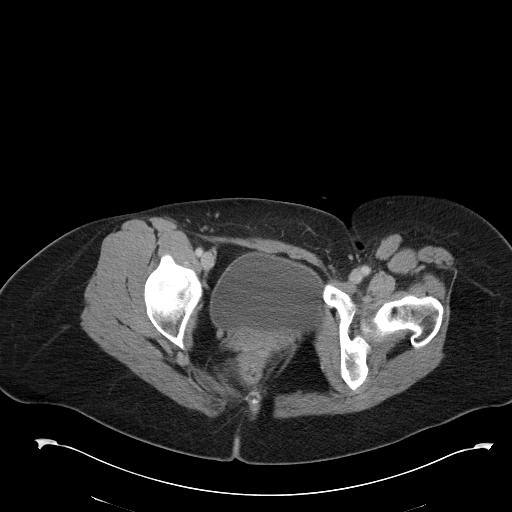
[im 24/91  soft-tissue]
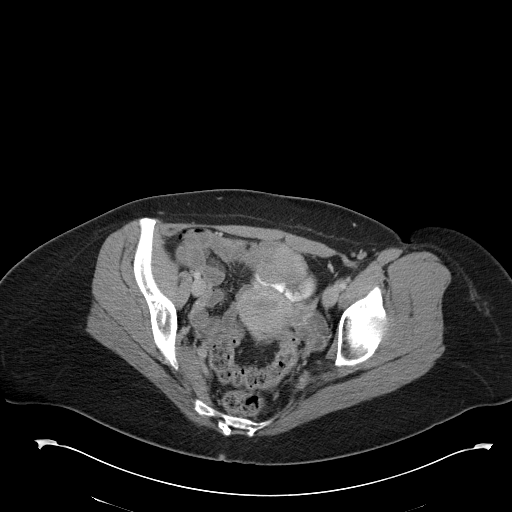
[im 32/91  soft-tissue]
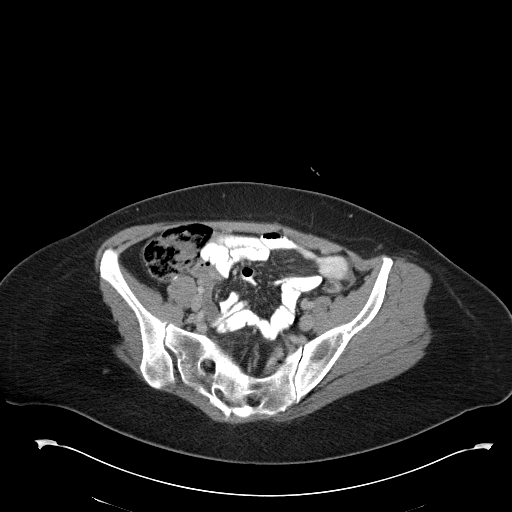
[im 36/91  soft-tissue]
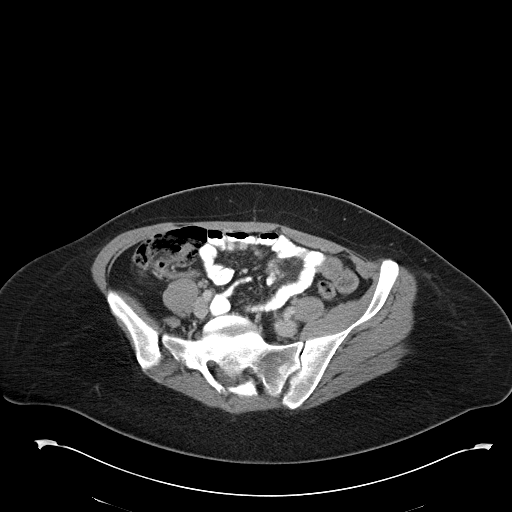
[im 44/91  soft-tissue]
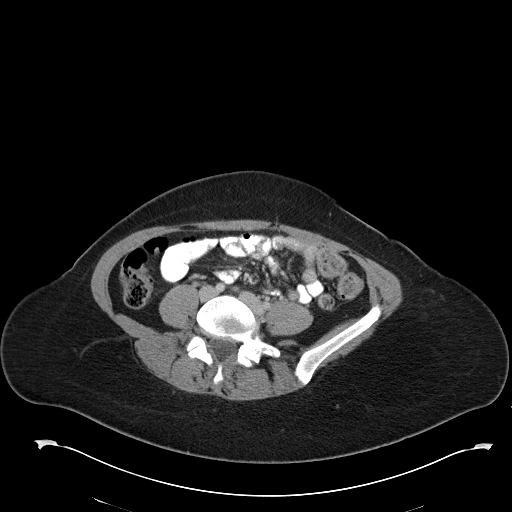
[im 47/91  soft-tissue]
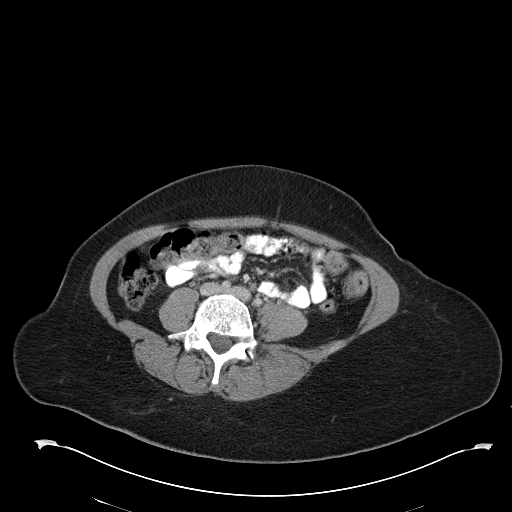
[im 55/91  soft-tissue]
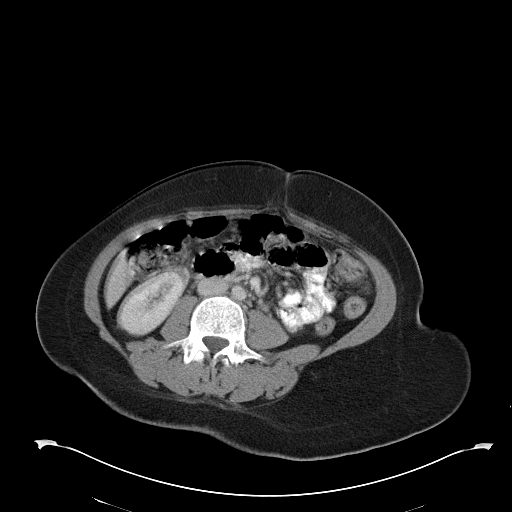
[im 55/91  bone]
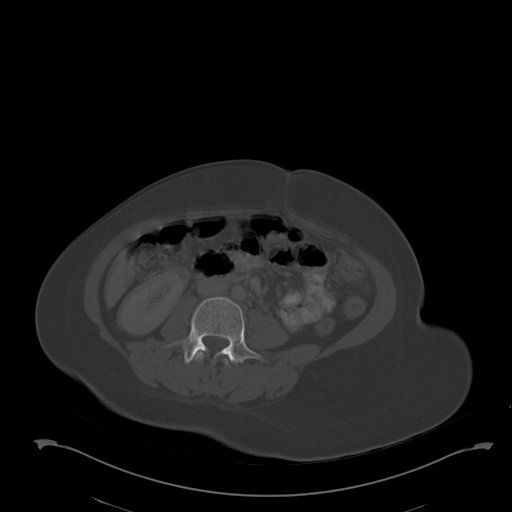
[im 59/91  soft-tissue]
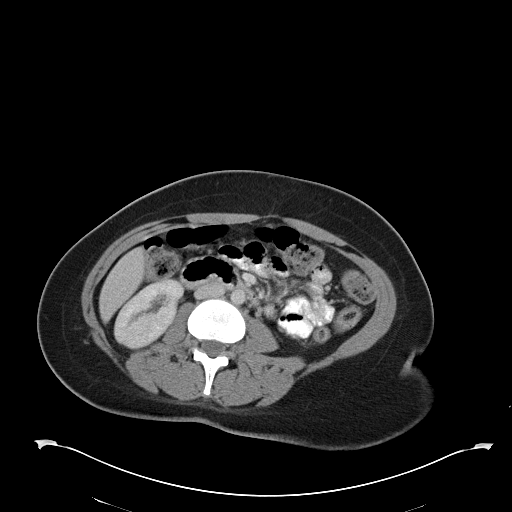
[im 67/91  soft-tissue]
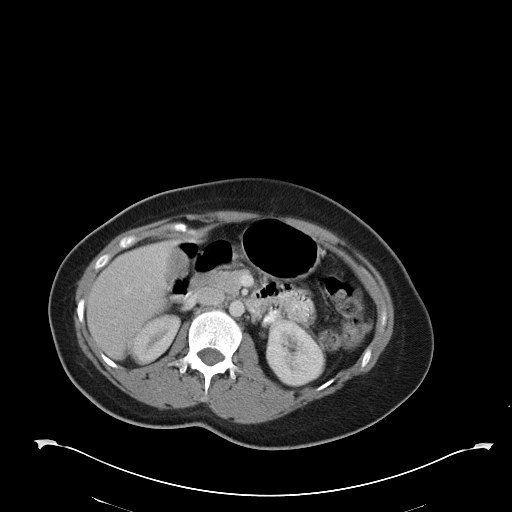
[im 75/91  soft-tissue]
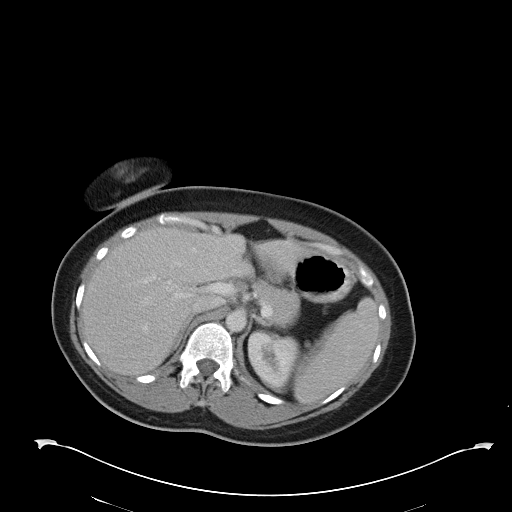
[im 79/91  soft-tissue]
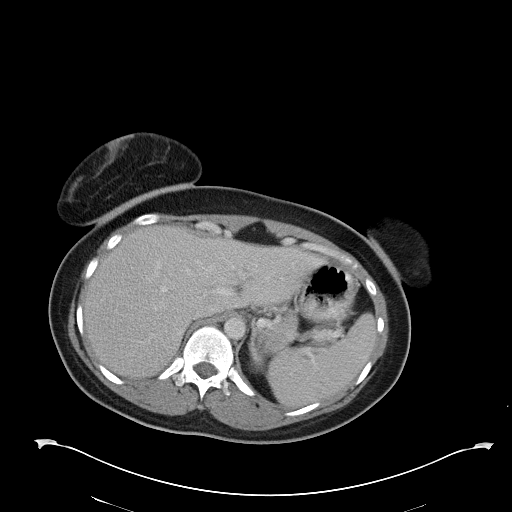
[im 87/91  soft-tissue]
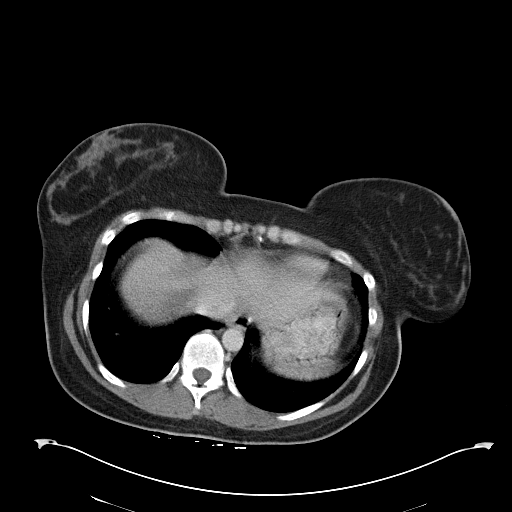

[Series 602: cor · coronal · 0.88mm/px · 3 of 105 slices shown]
[im 35/105  soft-tissue]
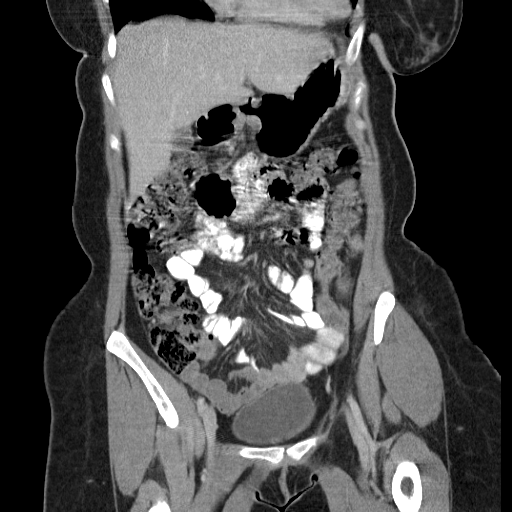
[im 47/105  soft-tissue]
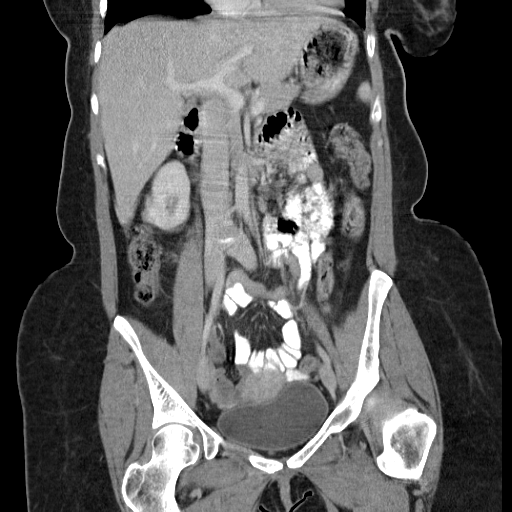
[im 58/105  soft-tissue]
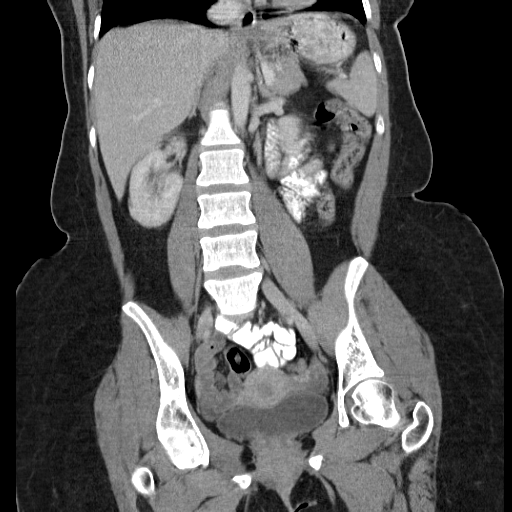

[17 of 46 positions shown; findings below may reference images not displayed]

FINDINGS: Lung bases are clear. No effusions. Heart is normal size.

Liver, gallbladder, spleen, pancreas, adrenals and kidneys are
normal. Changes of prior appendectomy. Stomach, large and small
bowel grossly unremarkable. No free fluid, free air or adenopathy.
Uterus and adnexa unremarkable as is the urinary bladder. Aorta is
normal caliber.

No acute bony abnormality.
IMPRESSION: No acute findings in the abdomen or pelvis.

## 2014-07-19 ENCOUNTER — Other Ambulatory Visit: Payer: Self-pay

## 2014-08-20 ENCOUNTER — Other Ambulatory Visit: Payer: Self-pay | Admitting: Family Medicine

## 2014-08-28 ENCOUNTER — Ambulatory Visit (INDEPENDENT_AMBULATORY_CARE_PROVIDER_SITE_OTHER): Payer: BC Managed Care – PPO | Admitting: Family Medicine

## 2014-08-28 ENCOUNTER — Ambulatory Visit (INDEPENDENT_AMBULATORY_CARE_PROVIDER_SITE_OTHER): Payer: BC Managed Care – PPO

## 2014-08-28 ENCOUNTER — Encounter: Payer: Self-pay | Admitting: Family Medicine

## 2014-08-28 VITALS — BP 124/81 | HR 95 | Temp 98.1°F | Resp 16 | Ht 64.0 in | Wt 184.2 lb

## 2014-08-28 DIAGNOSIS — R221 Localized swelling, mass and lump, neck: Secondary | ICD-10-CM

## 2014-08-28 DIAGNOSIS — I10 Essential (primary) hypertension: Secondary | ICD-10-CM

## 2014-08-28 DIAGNOSIS — E039 Hypothyroidism, unspecified: Secondary | ICD-10-CM

## 2014-08-28 DIAGNOSIS — K219 Gastro-esophageal reflux disease without esophagitis: Secondary | ICD-10-CM

## 2014-08-28 LAB — CBC
HCT: 38.1 % (ref 36.0–46.0)
HEMOGLOBIN: 13.2 g/dL (ref 12.0–15.0)
MCH: 28.1 pg (ref 26.0–34.0)
MCHC: 34.6 g/dL (ref 30.0–36.0)
MCV: 81.2 fL (ref 78.0–100.0)
MPV: 11.9 fL (ref 9.4–12.4)
Platelets: 389 10*3/uL (ref 150–400)
RBC: 4.69 MIL/uL (ref 3.87–5.11)
RDW: 14 % (ref 11.5–15.5)
WBC: 19.9 10*3/uL — ABNORMAL HIGH (ref 4.0–10.5)

## 2014-08-28 LAB — COMPREHENSIVE METABOLIC PANEL
ALBUMIN: 4.4 g/dL (ref 3.5–5.2)
ALT: 30 U/L (ref 0–35)
AST: 23 U/L (ref 0–37)
Alkaline Phosphatase: 78 U/L (ref 39–117)
BUN: 12 mg/dL (ref 6–23)
CALCIUM: 10.1 mg/dL (ref 8.4–10.5)
CHLORIDE: 100 meq/L (ref 96–112)
CO2: 26 meq/L (ref 19–32)
CREATININE: 0.88 mg/dL (ref 0.50–1.10)
Glucose, Bld: 79 mg/dL (ref 70–99)
POTASSIUM: 4.4 meq/L (ref 3.5–5.3)
Sodium: 135 mEq/L (ref 135–145)
Total Bilirubin: 0.3 mg/dL (ref 0.2–1.2)
Total Protein: 8.1 g/dL (ref 6.0–8.3)

## 2014-08-28 LAB — THYROID PANEL WITH TSH
Free Thyroxine Index: 2.3 (ref 1.4–3.8)
T3 Uptake: 29 % (ref 22–35)
T4 TOTAL: 8 ug/dL (ref 4.5–12.0)
TSH: 2.832 u[IU]/mL (ref 0.350–4.500)

## 2014-08-28 MED ORDER — LISINOPRIL 10 MG PO TABS: 10.0000 mg | ORAL_TABLET | Freq: Every day | ORAL | Status: DC

## 2014-08-28 MED ORDER — OMEPRAZOLE 40 MG PO CPDR: 40.0000 mg | DELAYED_RELEASE_CAPSULE | Freq: Every day | ORAL | Status: DC

## 2014-08-28 NOTE — Patient Instructions (Signed)
For nasal congestion- can take plain sudafed in twice a day- in the morning and early afternoon. Continue to drink a lot of fluids. Try salt water gargles for sore throat.

## 2014-08-28 NOTE — Progress Notes (Signed)
Subjective:    Patient ID: Julia [name], female    DOB: Aug 11, 1981, 33 y.o.   MRN: [id number]  HPI This is a 33 yo female who is brought in by her mother. The patient is mentally handicapped. She is conversant and able to describe her illness history and symptoms.  The patient was seen at another urgent care 6 days ago and diagnosed with sinus infection and started on Cefdinir 300 mg po BID. She has also been using Deco with some relief. She has thick nasal drainage and intermittent dizziness. She and her mother report that the patient has had intermittent feelings of warmth- she subjectively feels hot and her skin feels warm to touch. These episodes started prior to URI symptoms. She is very sensitive to environmental chemicals and smells. Patient has had a sore throat for more than a month- the mother believes this was precipitated by the patient going into [employer]. She has had post nasal drainage. She gets some relief with hot tea and advil.   Anterior neck has been sore off and on for awhile. It hurts worse with lying down.   Review of Systems Feels hot, nasal drainage yellow and clear, cough getting better, left ear stopped up, no palpitations, no anxiety/stress    Objective:   Physical Exam  Constitutional: She is oriented to person, place, and time. She appears well-developed and well-nourished.  HENT:  Head: Normocephalic and atraumatic.  Right Ear: Tympanic membrane, external ear and ear canal normal.  Left Ear: External ear and ear canal normal. Tympanic membrane is scarred.  Nose: Mucosal edema and rhinorrhea present. Right sinus exhibits no maxillary sinus tenderness and no frontal sinus tenderness. Left sinus exhibits no maxillary sinus tenderness and no frontal sinus tenderness.  Mouth/Throat: Uvula is midline and mucous membranes are normal. Posterior oropharyngeal erythema present. No oropharyngeal exudate, posterior oropharyngeal edema or tonsillar abscesses.   Eyes: Conjunctivae and EOM are normal. Pupils are equal, round, and reactive to light.  Neck: Normal range of motion.  Anterior neck appears swollen with left side slightly more swollen than right. Questionable thyromegaly with palpable fullness. ROM ok.   Cardiovascular: Normal rate, regular rhythm and normal heart sounds.   Pulmonary/Chest: Effort normal and breath sounds normal.  Musculoskeletal: Normal range of motion.  Neurological: She is alert and oriented to person, place, and time.  Skin: Skin is warm and dry.  Psychiatric: She has a normal mood and affect. Her behavior is normal. Judgment and thought content normal.  Vitals reviewed. BP 124/81 mmHg  Pulse 95  Temp(Src) 98.1 F (36.7 C) (Oral)  Resp 16  Ht 5\' 4"  (1.626 m)  Wt 184 lb 3.2 oz (83.553 kg)  BMI 31.60 kg/m2  SpO2 100%  LMP 08/04/2014  Xray of soft tissue of neck, [facility] reading (PRIMARY) by  Dr. Katrinka BlazingSmith- no abnormality     Assessment & Plan:  Discussed with  Dr. Katrinka BlazingSmith who examined the patient  1. Neck swelling - DG Neck Soft Tissue; Future - CBC - Comprehensive metabolic panel - Thyroid Panel With TSH - US Soft Tissue Head/Neck; Future  2. Hypothyroidism, unspecified hypothyroidism type - CBC - Comprehensive metabolic panel - Thyroid Panel With TSH - US Soft Tissue Head/Neck; Future  3. Essential hypertension - lisinopril (PRINIVIL,ZESTRIL) 10 MG tablet; Take 1 tablet (10 mg total) by mouth daily.  Dispense: 90 tablet; Refill: 1  4. Gastroesophageal reflux disease, esophagitis presence not specified - omeprazole (PRILOSEC) 40 MG capsule; Take 1  capsule (40 mg total) by mouth daily. PATIENT NEEDS OFFICE VISIT FOR ADDITIONAL REFILLS  Dispense: 30 capsule; Refill: 5  5. URI- resolving on Cefdinir - Finish antibiotic, discussed further symptomatic relief - Push fluids  Emi Belfasteborah B. Barbara Ahart, FNP-BC  [facility]  08/30/2014 7:40 AM

## 2014-08-30 ENCOUNTER — Telehealth: Payer: Self-pay | Admitting: Family Medicine

## 2014-08-30 ENCOUNTER — Other Ambulatory Visit: Payer: Self-pay | Admitting: Family Medicine

## 2014-08-30 DIAGNOSIS — E039 Hypothyroidism, unspecified: Secondary | ICD-10-CM

## 2014-08-30 MED ORDER — LEVOTHYROXINE SODIUM 100 MCG PO TABS: 100.0000 ug | ORAL_TABLET | Freq: Every day | ORAL | Status: DC

## 2014-08-30 NOTE — Telephone Encounter (Signed)
Created in error

## 2014-09-03 ENCOUNTER — Telehealth: Payer: Self-pay

## 2014-09-03 NOTE — Telephone Encounter (Signed)
PATIENT'S MOTHER (JANICE) STATES THE LAB CALLED REGARDING HER DAUGHTER'S RESULTS. SHE SAW DEBBIE GESSNER ABOUT 1 WEEK AGO FOR NECK SWELLING. SHE  IS RETURNING OUR CALL.  BEST PHONE [phone] (JANICE'S WORK PHONE FROM 8:30 TO 5:00)  PHARMACY CHOICE IS [facility].  MBC

## 2014-09-04 NOTE — Telephone Encounter (Signed)
LM on dedicated VM with results as normal- no medication changes. If no better following abx RTC.   Notes Recorded by Emi Belfasteborah B Gessner, FNP on 08/30/2014 at 2:45 PM Tried to get in touch with patient's mother, Julia [name]. No answer. Please try to call her and let her know that Julia [name]'s thyroid level is good and I will refill her synthroid at the current dose. Her white blood cell count is elevated. If she is not feeling better after finishing the antibiotic, or if she is getting worse, please let us know or return to clinic.

## 2014-09-05 ENCOUNTER — Telehealth: Payer: Self-pay

## 2014-09-05 NOTE — Telephone Encounter (Signed)
Patient mother states they are applying for something (wasn't specific) and they need to know the exact date of when patient was first diagnosed with asthma. Mom is on DPR form and we are allowed to disclose information to her. Will check records and call her back this afternoon. Cb# [phone] (work #)

## 2014-09-05 NOTE — Telephone Encounter (Signed)
Returned call to patient mother Jan. The earliest date I found referencing asthma was April 18, 2009. She asked what to do if she needed documentation and I told her to complete a ROI form. She will call to see if she needs documentation and let us know if she does.

## 2014-09-06 ENCOUNTER — Ambulatory Visit
Admission: RE | Admit: 2014-09-06 | Discharge: 2014-09-06 | Disposition: A | Discharge status: Home / Self Care | Payer: BC Managed Care – PPO | Source: Ambulatory Visit | Attending: Family Medicine | Admitting: Family Medicine

## 2014-09-06 DIAGNOSIS — E039 Hypothyroidism, unspecified: Secondary | ICD-10-CM

## 2014-09-06 DIAGNOSIS — R221 Localized swelling, mass and lump, neck: Secondary | ICD-10-CM

## 2014-09-09 ENCOUNTER — Other Ambulatory Visit: Payer: Self-pay | Admitting: Family Medicine

## 2014-09-09 DIAGNOSIS — E079 Disorder of thyroid, unspecified: Secondary | ICD-10-CM

## 2014-09-10 ENCOUNTER — Other Ambulatory Visit: Payer: Self-pay

## 2014-09-10 MED ORDER — MONTELUKAST SODIUM 10 MG PO TABS: 10.0000 mg | ORAL_TABLET | Freq: Every day | ORAL | Status: DC

## 2014-09-10 NOTE — Telephone Encounter (Signed)
Pt's mother called and advised that pt's singulair did not get sent in at [facility]. I sent in RFs to pharm

## 2014-09-12 ENCOUNTER — Encounter: Payer: Self-pay | Admitting: Radiology

## 2014-09-14 ENCOUNTER — Telehealth: Payer: Self-pay

## 2014-09-14 MED ORDER — DOXYCYCLINE HYCLATE 100 MG PO CAPS: 100.0000 mg | ORAL_CAPSULE | Freq: Two times a day (BID) | ORAL | Status: DC

## 2014-09-14 NOTE — Telephone Encounter (Signed)
Pt's mother advised. 

## 2014-09-14 NOTE — Telephone Encounter (Signed)
Patient mother Liborio NixonJanice say patient was recently seen by Deboraha Sprangebbie Gessner and is wondering if she can get a prescription sent to the pharmacy. Symptoms include throat redness, irritation, and swelling. Patient recently had a thyroid scan but it didn't reveal any masses. She is not experiencing any SOB or difficulty breathing. Preferred pharmacy is [facility] but they close at 3pm today so the other choice is [facility] on [address] if after 3pm. Mother is on privacy form. Cb# [phone].

## 2014-09-14 NOTE — Telephone Encounter (Signed)
Meds ordered this encounter  Medications  . doxycycline (VIBRAMYCIN) 100 MG capsule    Sig: Take 1 capsule (100 mg total) by mouth 2 (two) times daily.    Dispense:  20 capsule    Refill:  0    Order Specific Question:  Supervising Provider    Answer:  Merla RichesOLITTLE, ROBERT P [[id number]]   Please advise mother that if symptoms persist, RTC for additional evaluation.

## 2014-09-20 ENCOUNTER — Encounter: Payer: Self-pay | Admitting: Internal Medicine

## 2014-09-20 ENCOUNTER — Ambulatory Visit (INDEPENDENT_AMBULATORY_CARE_PROVIDER_SITE_OTHER): Payer: BC Managed Care – PPO | Admitting: Internal Medicine

## 2014-09-20 ENCOUNTER — Telehealth: Payer: Self-pay

## 2014-09-20 VITALS — BP 118/68 | HR 80 | Temp 97.2°F | Resp 12 | Ht 64.0 in | Wt 180.8 lb

## 2014-09-20 DIAGNOSIS — H6502 Acute serous otitis media, left ear: Secondary | ICD-10-CM

## 2014-09-20 DIAGNOSIS — E039 Hypothyroidism, unspecified: Secondary | ICD-10-CM

## 2014-09-20 DIAGNOSIS — J0120 Acute ethmoidal sinusitis, unspecified: Secondary | ICD-10-CM

## 2014-09-20 LAB — CBC WITH DIFFERENTIAL/PLATELET
BASOS ABS: 0.1 10*3/uL (ref 0.0–0.1)
BASOS PCT: 0.7 % (ref 0.0–3.0)
EOS ABS: 0.3 10*3/uL (ref 0.0–0.7)
Eosinophils Relative: 2.9 % (ref 0.0–5.0)
HCT: 40.5 % (ref 36.0–46.0)
HEMOGLOBIN: 13 g/dL (ref 12.0–15.0)
LYMPHS ABS: 2.4 10*3/uL (ref 0.7–4.0)
LYMPHS PCT: 25.4 % (ref 12.0–46.0)
MCHC: 32 g/dL (ref 30.0–36.0)
MCV: 85 fl (ref 78.0–100.0)
MONO ABS: 1.1 10*3/uL — AB (ref 0.1–1.0)
Monocytes Relative: 11.4 % (ref 3.0–12.0)
NEUTROS ABS: 5.6 10*3/uL (ref 1.4–7.7)
Neutrophils Relative %: 59.6 % (ref 43.0–77.0)
Platelets: 260 10*3/uL (ref 150.0–400.0)
RBC: 4.77 Mil/uL (ref 3.87–5.11)
RDW: 15.3 % (ref 11.5–15.5)
WBC: 9.4 10*3/uL (ref 4.0–10.5)

## 2014-09-20 NOTE — Patient Instructions (Addendum)
Please continue Levothyroxine 100 mcg daily.  Take the thyroid hormone every day, with water, >30 minutes before breakfast, separated by >4 hours from acid reflux medications, calcium, iron, multivitamins.  Please return in 4 months. Please come back in 5 weeks for labs.  If you do not feel better by Monday, please let Dr Patsy Lageropland know.  Hypothyroidism The thyroid is a large gland located in the lower front of your neck. The thyroid gland helps control metabolism. Metabolism is how your body handles food. It controls metabolism with the hormone thyroxine. When this gland is underactive (hypothyroid), it produces too little hormone.  CAUSES These include:   Absence or destruction of thyroid tissue.  Goiter due to iodine deficiency.  Goiter due to medications.  Congenital defects (since birth).  Problems with the pituitary. This causes a lack of TSH (thyroid stimulating hormone). This hormone tells the thyroid to turn out more hormone. SYMPTOMS  Lethargy (feeling as though you have no energy)  Cold intolerance  Weight gain (in spite of normal food intake)  Dry skin  Coarse hair  Menstrual irregularity (if severe, may lead to infertility)  Slowing of thought processes Cardiac problems are also caused by insufficient amounts of thyroid hormone. Hypothyroidism in the newborn is cretinism, and is an extreme form. It is important that this form be treated adequately and immediately or it will lead rapidly to retarded physical and mental development. DIAGNOSIS  To prove hypothyroidism, your caregiver may do blood tests and ultrasound tests. Sometimes the signs are hidden. It may be necessary for your caregiver to watch this illness with blood tests either before or after diagnosis and treatment. TREATMENT  Low levels of thyroid hormone are increased by using synthetic thyroid hormone. This is a safe, effective treatment. It usually takes about four weeks to gain the full effects of  the medication. After you have the full effect of the medication, it will generally take another four weeks for problems to leave. Your caregiver may start you on low doses. If you have had heart problems the dose may be gradually increased. It is generally not an emergency to get rapidly to normal. HOME CARE INSTRUCTIONS   Take your medications as your caregiver suggests. Let your caregiver know of any medications you are taking or start taking. Your caregiver will help you with dosage schedules.  As your condition improves, your dosage needs may increase. It will be necessary to have continuing blood tests as suggested by your caregiver.  Report all suspected medication side effects to your caregiver. SEEK MEDICAL CARE IF: Seek medical care if you develop:  Sweating.  Tremulousness (tremors).  Anxiety.  Rapid weight loss.  Heat intolerance.  Emotional swings.  Diarrhea.  Weakness. SEEK IMMEDIATE MEDICAL CARE IF:  You develop chest pain, an irregular heart beat (palpitations), or a rapid heart beat. MAKE SURE YOU:   Understand these instructions.  Will watch your condition.  Will get help right away if you are not doing well or get worse. Document Released: 09/20/2005 Document Revised: 12/13/2011 Document Reviewed: 05/10/2008 [facility] Patient Information 2015 [place], [place]. This information is not intended to replace advice given to you by your health care provider. Make sure you discuss any questions you have with your health care provider.

## 2014-09-20 NOTE — Progress Notes (Signed)
Patient ID: Julia LoveChristine R Mashek, female   DOB: January 13, 1981, 33 y.o.   MRN: [id number]   HPI  Julia [name] is a 33 y.o.-year-old female, referred by her PCP, Dr. Patsy Lageropland, for management of hypothyroidism. She is here with her mother who offers part of the hx.  Pt. has been dx with hypothyroidism in 2012 after she c/o fatigue and having cold hands.  She is on Levothyroxine 100 mcg, taken: - fasting - with coffee + creamer - with b'fast! - no calcium, iron - + PPI (Prilosec)! - + multivitamins with b'fast!  I reviewed pt's thyroid tests: Lab Results  Component Value Date   TSH 2.832 08/28/2014   TSH 3.223 03/05/2014   TSH 0.118* 05/17/2013   TSH 7.746* 01/20/2013   TSH 9.486* 07/05/2012   TSH 9.277* 04/18/2012   TSH 3.236 11/14/2011    Pt describes: - + dizziness - but this is associated with a recent episode of sinusitis - no weight gain - + fatigue - no cold intolerance, has heat intolerance - no anxiety / depression - no constipation - no dry skin - No hair loss  Pt denies feeling nodules in neck, hoarseness, dysphagia/odynophagia, SOB with lying down.  She has + FH of thyroid disorders in: PGM, aunt. No FH of thyroid cancer.  No h/o radiation tx to head or neck. No recent use of iodine supplements.  She also has sore throat and would want me to check her ears. She describes a popping sound in her L ear. She has been on Doxycycline x 7 days >> only a little better, but still very dizzy.   She has a history of asthma, and allergies to scents, smoke, gasoline, grass.  ROS: Constitutional: See history of present illness Eyes: no blurry vision, no xerophthalmia ENT: + sore throat, no nodules palpated in throat, no dysphagia/odynophagia, + hoarseness; + decreased hearing ,complains of popping sound in the left ear Cardiovascular: no CP/+ SOB/no palpitations/leg swelling Respiratory: no cough/+ SOB Gastrointestinal: + N/no V/D/C. + Acid reflux Musculoskeletal: no  muscle/joint aches Skin: no rashes Neurological: no tremors/numbness/tingling/+ dizziness, + vertigo, + headache Psychiatric: no depression/anxiety  Past Medical History  Diagnosis Date  . Allergy   . Asthma   . Hyperlipidemia   . Hypertension   . Ulcer    Past Surgical History  Procedure Laterality Date  . Appendectomy     History   Social History  . Marital Status: Single    Spouse Name: N/A    Number of Children: 0   Occupational History  . unemployed   Social History Main Topics  . Smoking status: Never Smoker   . Smokeless tobacco: Not on file  . Alcohol Use: No  . Drug Use: No  . Sexual Activity: No   Current Outpatient Prescriptions on File Prior to Visit  Medication Sig Dispense Refill  . doxycycline (VIBRAMYCIN) 100 MG capsule Take 1 capsule (100 mg total) by mouth 2 (two) times daily. 20 capsule 0  . ibuprofen (ADVIL,MOTRIN) 200 MG tablet Take 600 mg by mouth every 6 (six) hours as needed.    Marland Kitchen. levothyroxine (SYNTHROID, LEVOTHROID) 100 MCG tablet Take 1 tablet (100 mcg total) by mouth daily. 30 tablet 5  . lisinopril (PRINIVIL,ZESTRIL) 10 MG tablet Take 1 tablet (10 mg total) by mouth daily. 90 tablet 1  . montelukast (SINGULAIR) 10 MG tablet Take 1 tablet (10 mg total) by mouth at bedtime. 90 tablet 1  . omeprazole (PRILOSEC) 40 MG capsule Take 1  capsule (40 mg total) by mouth daily. PATIENT NEEDS OFFICE VISIT FOR ADDITIONAL REFILLS 30 capsule 5  . cefdinir (OMNICEF) 300 MG capsule Take 300 mg by mouth 2 (two) times daily.    . fluticasone (FLONASE) 50 MCG/ACT nasal spray Place 2 sprays into the nose daily. 1 g 1   No current facility-administered medications on file prior to visit.   Allergies  Allergen Reactions  . Azithromycin Anaphylaxis    z-pac  . Latex Shortness Of Breath  . Sulfa Antibiotics Shortness Of Breath and Nausea And Vomiting   Family History  Problem Relation Age of Onset  . Hypertension Mother   . Hypertension Father   .  Hypertension Brother   . Asthma Maternal Grandmother   . Hypertension Maternal Grandfather   . Arthritis Paternal Grandmother   . COPD Paternal Grandfather    PE: BP 118/68 mmHg  Pulse 80  Temp(Src) 97.2 F (36.2 C) (Oral)  Resp 12  Ht 5\' 4"  (1.626 m)  Wt 180 lb 12.8 oz (82.01 kg)  BMI 31.02 kg/m2  SpO2 97%  LMP 08/04/2014 Wt Readings from Last 3 Encounters:  09/20/14 180 lb 12.8 oz (82.01 kg)  08/28/14 184 lb 3.2 oz (83.553 kg)  03/05/14 183 lb 12.8 oz (83.371 kg)   Constitutional: overweight, in NAD Eyes: PERRLA, EOMI, no exophthalmos ENT: moist mucous membranes,  pharyngeal membrane not erythematous, no tonsillomegaly, no thyromegaly, no cervical lymphadenopathy; right tympanic membrane erythematous; fluid bullae behind left tympanic membrane showing fluid levels  Cardiovascular: RRR, No MRG Respiratory: CTA B Gastrointestinal: abdomen soft, NT, ND, BS+ Musculoskeletal: no deformities, strength intact in all 4 Skin: moist, warm, no rashes Neurological: no tremor with outstretched hands, DTR normal in all 4  ASSESSMENT: 1. Hypothyroidism  2. L Serous otitis media - high WBC   PLAN:  1. Patient with long-standing hypothyroidism, on levothyroxine therapy. She appears euthyroid. She does not appear to have a goiter, thyroid nodules, or neck compression symptoms. I reviewed her latest thyroid tests obtained 3 weeks ago and is were normal - We discussed about correct intake of levothyroxine, fasting, with water, separated by at least 30 minutes from breakfast, and separated by more than 4 hours from calcium, iron, multivitamins, acid reflux medications (PPIs). she needs to separate her levothyroxine from her food, MVI and PPIs and will need to recheck her thyroid tests in 5 weeks after this change  - since patient and her mom are curious about the etiology of her hypothyroidism, I will check TPO antibodies today to see she has Hashimoto thyroiditis. I discussed about what this  diagnosis means. Return in about 4 months (around 01/20/2015).  2. Serous otitis media - The patient had a high white count at the last visit with PCP 3 weeks ago. Per mother's request, we reviewed this result together. Also, per mother and patient's request, we'll recheck her white count. - Patient is on doxycycline for a week, and she feels slightly better but not completely - Her left ear shows fluid levels behind the TM, a sign of serous otitis media.  - She is on doxycycline, however, I advised her that if she does not feel better (has dizziness/vertigo) in 2 days (after the weekend) , to call PCP and see if she needs to be seen again or change the antibiotic.   Office Visit on 09/20/2014  Component Date Value Ref Range Status  . Thyroid Peroxidase Antibody 09/20/2014 114* <9 IU/mL Final   Comment:   **Please note change  in methodology. If re-baselining is needed, for patients who are being serially tested, please call customer service, within 3 days of collection, to request to add on the appropriate re-baselining test code for the previous methodology, at no charge.**   . WBC 09/20/2014 9.4  4.0 - 10.5 K/uL Final  . RBC 09/20/2014 4.77  3.87 - 5.11 Mil/uL Final  . Hemoglobin 09/20/2014 13.0  12.0 - 15.0 g/dL Final  . HCT 16/07/9603 40.5  36.0 - 46.0 % Final  . MCV 09/20/2014 85.0  78.0 - 100.0 fl Final  . MCHC 09/20/2014 32.0  30.0 - 36.0 g/dL Final  . RDW 54/06/8118 15.3  11.5 - 15.5 % Final  . Platelets 09/20/2014 260.0  150.0 - 400.0 K/uL Final  . Neutrophils Relative % 09/20/2014 59.6  43.0 - 77.0 % Final  . Lymphocytes Relative 09/20/2014 25.4  12.0 - 46.0 % Final  . Monocytes Relative 09/20/2014 11.4  3.0 - 12.0 % Final  . Eosinophils Relative 09/20/2014 2.9  0.0 - 5.0 % Final  . Basophils Relative 09/20/2014 0.7  0.0 - 3.0 % Final  . Neutro Abs 09/20/2014 5.6  1.4 - 7.7 K/uL Final  . Lymphs Abs 09/20/2014 2.4  0.7 - 4.0 K/uL Final  . Monocytes Absolute 09/20/2014 1.1*  0.1 - 1.0 K/uL Final  . Eosinophils Absolute 09/20/2014 0.3  0.0 - 0.7 K/uL Final  . Basophils Absolute 09/20/2014 0.1  0.0 - 0.1 K/uL Final   Msg sent: Dear Ms. Beyl, Your white blood cell count is now normal, which is great! I hope you are feeling better! Your thyroid antibodies are high >> you have Hashimoto's thyroiditis as a cause for your hypothyroidism. As we discussed, this is an autoimmune disease. It is very common, actually the most common cause of hypothyroidism in [place]. The treatment is levothyroxine, which you are already taking. Please let me know if you have any questions. Sincerely, Carlus Pavlov MD

## 2014-09-20 NOTE — Telephone Encounter (Signed)
Pt's mother called us in regards to an unable to reach letter. Notified of labs. Still feeling dizzy and having hot flashes. She is seeing Endocrinology today.

## 2014-09-21 LAB — THYROID PEROXIDASE ANTIBODY: Thyroperoxidase Ab SerPl-aCnc: 114 IU/mL — ABNORMAL HIGH (ref <9)

## 2014-11-26 ENCOUNTER — Ambulatory Visit (INDEPENDENT_AMBULATORY_CARE_PROVIDER_SITE_OTHER): Payer: BLUE CROSS/BLUE SHIELD | Admitting: Emergency Medicine

## 2014-11-26 VITALS — BP 110/88 | HR 104 | Temp 97.9°F | Resp 16 | Ht 64.0 in | Wt 182.0 lb

## 2014-11-26 DIAGNOSIS — J209 Acute bronchitis, unspecified: Secondary | ICD-10-CM

## 2014-11-26 DIAGNOSIS — J0140 Acute pansinusitis, unspecified: Secondary | ICD-10-CM

## 2014-11-26 MED ORDER — PSEUDOEPHEDRINE-GUAIFENESIN ER 60-600 MG PO TB12
1.0000 | ORAL_TABLET | Freq: Two times a day (BID) | ORAL | Status: DC
Start: 2014-11-26 — End: 2015-02-25

## 2014-11-26 MED ORDER — HYDROCOD POLST-CHLORPHEN POLST 10-8 MG/5ML PO LQCR: 5.0000 mL | Freq: Two times a day (BID) | ORAL | Status: DC | PRN

## 2014-11-26 MED ORDER — AMOXICILLIN-POT CLAVULANATE 875-125 MG PO TABS: 1.0000 | ORAL_TABLET | Freq: Two times a day (BID) | ORAL | Status: DC

## 2014-11-26 NOTE — Patient Instructions (Signed)

## 2014-11-26 NOTE — Progress Notes (Signed)
[facility] [address], [place] [address] [phone]  Date:  11/26/2014   Name:  Julia [name]   DOB:  09-07-1981   MRN:  [id number]  PCP:  Abbe Amsterdam, MD    Chief Complaint: Nasal Congestion; Cough; Sore Throat; and Wheezing   History of Present Illness:  Julia [name] is a 34 y.o. very pleasant female patient who presents with the following:  Ill for two weeks with nasal congestion and drainage.   Purulent drainage and post nasal drip.   Pressure in cheeks and forehead Sore throat no ear pain No fever or chills Cough productive of mucopurulent sputum Some wheezing.  No shortness of breath Cough worse at night. No nausea or vomiting.  No stool change No improvement with over the counter medications or other home remedies.  Denies other complaint or health concern today. .  Patient Active Problem List   Diagnosis Date Noted  . Hypothyroidism 01/20/2013  . Asthma 01/20/2013  . Mental handicap 07/05/2012  . Asthma 11/14/2011  . HTN (hypertension) 11/14/2011    Past Medical History  Diagnosis Date  . Allergy   . Asthma   . Hyperlipidemia   . Hypertension   . Ulcer     Past Surgical History  Procedure Laterality Date  . Appendectomy      History  Substance Use Topics  . Smoking status: Never Smoker   . Smokeless tobacco: Not on file  . Alcohol Use: No    Family History  Problem Relation Age of Onset  . Hypertension Mother   . Hypertension Father   . Hypertension Brother   . Asthma Maternal Grandmother   . Hypertension Maternal Grandfather   . Arthritis Paternal Grandmother   . COPD Paternal Grandfather     Allergies  Allergen Reactions  . Azithromycin Anaphylaxis    z-pac  . Latex Shortness Of Breath  . Sulfa Antibiotics Shortness Of Breath and Nausea And Vomiting    Medication list has been reviewed and updated.  Current Outpatient Prescriptions on File Prior to Visit  Medication Sig Dispense  Refill  . ibuprofen (ADVIL,MOTRIN) 200 MG tablet Take 600 mg by mouth every 6 (six) hours as needed.    Marland Kitchen levothyroxine (SYNTHROID, LEVOTHROID) 100 MCG tablet Take 1 tablet (100 mcg total) by mouth daily. 30 tablet 5  . lisinopril (PRINIVIL,ZESTRIL) 10 MG tablet Take 1 tablet (10 mg total) by mouth daily. 90 tablet 1  . montelukast (SINGULAIR) 10 MG tablet Take 1 tablet (10 mg total) by mouth at bedtime. 90 tablet 1  . omeprazole (PRILOSEC) 40 MG capsule Take 1 capsule (40 mg total) by mouth daily. PATIENT NEEDS OFFICE VISIT FOR ADDITIONAL REFILLS 30 capsule 5   No current facility-administered medications on file prior to visit.    Review of Systems:  As per HPI, otherwise negative.    Physical Examination: Filed Vitals:   11/26/14 0839  BP: 110/88  Pulse: 104  Temp: 97.9 F (36.6 C)  Resp: 16   Filed Vitals:   11/26/14 0839  Height:  (1.626 m)  Weight: 182 lb (82.555 kg)   Body mass index is 31.22 kg/(m^2). Ideal Body Weight: Weight in (lb) to have BMI = 25: 145.3  GEN: WDWN, NAD, Non-toxic, A & O x 3 HEENT: Atraumatic, Normocephalic. Neck supple. No masses, No LAD. Ears and Nose: No external deformity.  Purulent nasal drainage CV: RRR, No M/G/R. No JVD. No thrill. No extra heart sounds. PULM: CTA  B, no wheezes, crackles, rhonchi. No retractions. No resp. distress. No accessory muscle use. ABD: S, NT, ND, +BS. No rebound. No HSM. EXTR: No c/c/e NEURO Normal gait.  PSYCH: Normally interactive. Conversant. Not depressed or anxious appearing.  Calm demeanor.    Assessment and Plan: Sinusitis Bronchitis augmentin mucinex  tussionex   Signed,  Phillips OdorJeffery Keni Wafer, MD

## 2015-02-25 ENCOUNTER — Ambulatory Visit (INDEPENDENT_AMBULATORY_CARE_PROVIDER_SITE_OTHER): Payer: BLUE CROSS/BLUE SHIELD | Admitting: Physician Assistant

## 2015-02-25 ENCOUNTER — Encounter: Payer: Self-pay | Admitting: Physician Assistant

## 2015-02-25 VITALS — BP 127/87 | HR 78 | Temp 98.5°F | Resp 18 | Ht 63.0 in | Wt 179.2 lb

## 2015-02-25 DIAGNOSIS — Z5181 Encounter for therapeutic drug level monitoring: Secondary | ICD-10-CM | POA: Diagnosis not present

## 2015-02-25 DIAGNOSIS — E039 Hypothyroidism, unspecified: Secondary | ICD-10-CM

## 2015-02-25 DIAGNOSIS — E038 Other specified hypothyroidism: Secondary | ICD-10-CM | POA: Insufficient documentation

## 2015-02-25 DIAGNOSIS — I10 Essential (primary) hypertension: Secondary | ICD-10-CM | POA: Diagnosis not present

## 2015-02-25 DIAGNOSIS — K219 Gastro-esophageal reflux disease without esophagitis: Secondary | ICD-10-CM

## 2015-02-25 DIAGNOSIS — E063 Autoimmune thyroiditis: Secondary | ICD-10-CM | POA: Insufficient documentation

## 2015-02-25 DIAGNOSIS — J4520 Mild intermittent asthma, uncomplicated: Secondary | ICD-10-CM | POA: Diagnosis not present

## 2015-02-25 LAB — VITAMIN B12: Vitamin B-12: 456 pg/mL (ref 211–911)

## 2015-02-25 LAB — THYROID PANEL WITH TSH
Free Thyroxine Index: 2.7 (ref 1.4–3.8)
T3 Uptake: 31 % (ref 22–35)
T4, Total: 8.8 ug/dL (ref 4.5–12.0)
TSH: 4.309 u[IU]/mL (ref 0.350–4.500)

## 2015-02-25 LAB — MAGNESIUM: Magnesium: 1.8 mg/dL (ref 1.5–2.5)

## 2015-02-25 MED ORDER — ALBUTEROL SULFATE HFA 108 (90 BASE) MCG/ACT IN AERS: 2.0000 | INHALATION_SPRAY | RESPIRATORY_TRACT | Status: DC | PRN

## 2015-02-25 MED ORDER — LEVOTHYROXINE SODIUM 100 MCG PO TABS: 100.0000 ug | ORAL_TABLET | Freq: Every day | ORAL | Status: DC

## 2015-02-25 MED ORDER — OMEPRAZOLE 40 MG PO CPDR: 40.0000 mg | DELAYED_RELEASE_CAPSULE | Freq: Every day | ORAL | Status: DC

## 2015-02-25 MED ORDER — MONTELUKAST SODIUM 10 MG PO TABS: 10.0000 mg | ORAL_TABLET | Freq: Every day | ORAL | Status: DC

## 2015-02-25 MED ORDER — LISINOPRIL 10 MG PO TABS: 10.0000 mg | ORAL_TABLET | Freq: Every day | ORAL | Status: DC

## 2015-02-25 NOTE — Patient Instructions (Signed)
We have filled your medications for 6 months. I drew labs today to check since you're on long term medications. We'll be in touch with you with those results.

## 2015-02-25 NOTE — Progress Notes (Signed)
Subjective:    Patient ID: Julia [name], female    DOB: July 19, 1981, 34 y.o.   MRN: [id number]  Chief Complaint  Patient presents with  . Medication Refill   Patient Active Problem List   Diagnosis Date Noted  . Hypothyroidism due to Hashimoto's thyroiditis 02/25/2015  . GERD (gastroesophageal reflux disease) 02/25/2015  . Asthma 01/20/2013  . Mental handicap 07/05/2012  . HTN (hypertension) 11/14/2011    Prior to Admission medications   Medication Sig Start Date End Date Taking? Authorizing Provider  ibuprofen (ADVIL,MOTRIN) 200 MG tablet Take 600 mg by mouth every 6 (six) hours as needed.   Yes Historical Provider, MD  levothyroxine (SYNTHROID, LEVOTHROID) 100 MCG tablet Take 1 tablet (100 mcg total) by mouth daily. 02/25/15  Yes Omni Dunsworth, PA  lisinopril (PRINIVIL,ZESTRIL) 10 MG tablet Take 1 tablet (10 mg total) by mouth daily. 02/25/15  Yes Abigaelle Verley, PA  montelukast (SINGULAIR) 10 MG tablet Take 1 tablet (10 mg total) by mouth at bedtime. 02/25/15  Yes Jaxn Chiquito, PA  omeprazole (PRILOSEC) 40 MG capsule Take 1 capsule (40 mg total) by mouth daily. 02/25/15  Yes Kayzlee Wirtanen, PA  albuterol (PROVENTIL HFA;VENTOLIN HFA) 108 (90 BASE) MCG/ACT inhaler Inhale 2 puffs into the lungs every 4 (four) hours as needed for wheezing or shortness of breath (cough, shortness of breath or wheezing.). 02/25/15   Raelyn Ensignodd Jerime Arif, PA   Medications, allergies, past medical history, surgical history, family history, social history and problem list reviewed and updated.  HPI  [age] yof presents needing med refills.   Lisinopril - has been on for several yrs. BP well controlled today and at prior visits. Does not check at home. She denies cp, sob, presyncope, syncope, ha, vision changes. She does not watch the salt in her diet. She does not exercise but is fairly active with walking. CMP 6 months ago normal scr, bun, k.   Singulair/albuterol - hx intermittent mild asthma. Takes singulair daily.  Uses albuterol maybe once month when she feels she is wheezing. Does not have persistent cough. Does not have sob at night.   Omeprazole - has taken 40 mg qd for several yrs. This keeps reflux sx at bay. Normal Ca on CMP 6 months ago.   Synthroid - on 100 mcg. Diagnosed with hypothyroid 2012. She saw endo 12/15, had high thyroid peroxidase antibodies, diag with hashimotos. Per their note they wanted to have pt repeat thyroid panel in few months. She is overdue for this. Denies heat/cold intolerance, wt gain/loss, edema, fatigue.   Review of Systems No cp, sob, fever, chills.     Objective:   Physical Exam  Constitutional: She is oriented to person, place, and time. She appears well-developed and well-nourished.  Non-toxic appearance. She does not have a sickly appearance. She does not appear ill. No distress.  BP 127/87 mmHg  Pulse 78  Temp(Src) 98.5 F (36.9 C) (Oral)  Resp 18  Ht 5\' 3"  (1.6 m)  Wt 179 lb 3.2 oz (81.285 kg)  BMI 31.75 kg/m2  SpO2 99%  LMP 02/01/2015 (Approximate)   Cardiovascular: Normal rate, regular rhythm and normal heart sounds.  Exam reveals no gallop.   No murmur heard. Neurological: She is alert and oriented to person, place, and time.  Psychiatric: She has a normal mood and affect. Her speech is normal.      Assessment & Plan:   [age] yof presents needing med refills.   Hypothyroidism due to Hashimoto's thyroiditis - Plan: levothyroxine (SYNTHROID,  LEVOTHROID) 100 MCG tablet, Thyroid Panel With TSH --refilled synthroid --thyroid panel today  Asthma, mild intermittent, uncomplicated - Plan: montelukast (SINGULAIR) 10 MG tablet, albuterol (PROVENTIL HFA;VENTOLIN HFA) 108 (90 BASE) MCG/ACT inhaler  Gastroesophageal reflux disease, esophagitis presence not specified - Plan: omeprazole (PRILOSEC) 40 MG capsule --refilled omeprazole 40 --pt encouraged to decrease dose to 20 mg qd and see if sx return --if they do come back to see [place], if they stay away stay  on 20 mg dose --normal Ca 6 months ago, checking B12 and Mag today  Essential hypertension - Plan: lisinopril (PRINIVIL,ZESTRIL) 10 MG tablet --well controlled today --normal bun/cr/k 6 months ago  Encounter for therapeutic drug monitoring - Plan: Vitamin B12, Magnesium  Donnajean Lopes, PA-C Physician Assistant-Certified [facility]  02/25/2015 7:01 PM

## 2015-04-14 ENCOUNTER — Ambulatory Visit (INDEPENDENT_AMBULATORY_CARE_PROVIDER_SITE_OTHER): Payer: BLUE CROSS/BLUE SHIELD | Admitting: Physician Assistant

## 2015-04-14 ENCOUNTER — Encounter: Payer: Self-pay | Admitting: Physician Assistant

## 2015-04-14 VITALS — BP 114/86 | HR 91 | Temp 99.0°F | Resp 16 | Ht 62.5 in | Wt 175.8 lb

## 2015-04-14 DIAGNOSIS — J0110 Acute frontal sinusitis, unspecified: Secondary | ICD-10-CM | POA: Diagnosis not present

## 2015-04-14 MED ORDER — IPRATROPIUM BROMIDE 0.03 % NA SOLN: 2.0000 | Freq: Two times a day (BID) | NASAL | Status: DC

## 2015-04-14 MED ORDER — BENZONATATE 100 MG PO CAPS: 100.0000 mg | ORAL_CAPSULE | Freq: Three times a day (TID) | ORAL | Status: DC | PRN

## 2015-04-14 NOTE — Progress Notes (Signed)
   Subjective:    Patient ID: Julia [name], female    DOB: 02/25/1981, 34 y.o.   MRN: [id number]  HPI Patient presents for sinus pressure that has been present for past 5 days and endorses congestion, rhinorrhea, HA, feeling hot, and productive cough. Denies fever, SOB, CP, wheezing, N/V. Has tried Mucinex w/o relief. H/o asthma that is currently controlled. No known sick contacts. Med allergy to azithromycin, sulfa antibx, and latex.   Review of Systems  Constitutional: Negative for fever, chills, appetite change and fatigue.  HENT: Positive for congestion, postnasal drip, rhinorrhea, sinus pressure and sore throat. Negative for ear discharge, ear pain, sneezing and voice change.   Eyes: Negative.   Respiratory: Positive for cough. Negative for chest tightness, shortness of breath and wheezing.   Cardiovascular: Negative for chest pain.  Gastrointestinal: Negative for nausea and vomiting.  Neurological: Positive for headaches. Negative for dizziness.       Objective:   Physical Exam  Constitutional: She is oriented to person, place, and time. She appears well-developed and well-nourished. No distress.  Blood pressure 114/86, pulse 91, temperature 99 F (37.2 C), temperature source Oral, resp. rate 16, height 5' 2.5" (1.588 m), weight 175 lb 12.8 oz (79.742 kg), last menstrual period 03/28/2015.  HENT:  Head: Normocephalic and atraumatic.  Right Ear: Tympanic membrane, external ear and ear canal normal.  Left Ear: Tympanic membrane, external ear and ear canal normal.  Nose: Rhinorrhea (with erythema) present. Right sinus exhibits frontal sinus tenderness. Right sinus exhibits no maxillary sinus tenderness. Left sinus exhibits frontal sinus tenderness. Left sinus exhibits no maxillary sinus tenderness.  Mouth/Throat: Uvula is midline and mucous membranes are normal. Posterior oropharyngeal erythema present. No oropharyngeal exudate.  Eyes: Conjunctivae are normal. Pupils are equal,  round, and reactive to light. Right eye exhibits no discharge. Left eye exhibits no discharge. No scleral icterus.  Neck: Normal range of motion. Neck supple. No thyromegaly present.  Cardiovascular: Normal rate, regular rhythm and normal heart sounds.  Exam reveals no gallop and no friction rub.   No murmur heard. Pulmonary/Chest: Effort normal and breath sounds normal. No respiratory distress. She has no decreased breath sounds. She has no wheezes. She has no rhonchi. She has no rales.  Abdominal: Soft. Bowel sounds are normal. She exhibits no distension. There is no tenderness. There is no rebound and no guarding.  Lymphadenopathy:    She has no cervical adenopathy.  Neurological: She is alert and oriented to person, place, and time.  Skin: Skin is warm and dry. No rash noted. She is not diaphoretic. No erythema.      Assessment & Plan:  1. Acute frontal sinusitis, recurrence not specified Increase water intake. If no improvement by end of week can call back and will send Amoxicillin. - benzonatate (TESSALON) 100 MG capsule; Take 1-2 capsules (100-200 mg total) by mouth 3 (three) times daily as needed for cough.  Dispense: 40 capsule; Refill: 0 - ipratropium (ATROVENT) 0.03 % nasal spray; Place 2 sprays into both nostrils 2 (two) times daily.  Dispense: 30 mL; Refill: 0   Samay Delcarlo PA-C  [facility] 04/14/2015 5:11 PM

## 2015-04-14 NOTE — Patient Instructions (Signed)

## 2015-08-26 ENCOUNTER — Ambulatory Visit (INDEPENDENT_AMBULATORY_CARE_PROVIDER_SITE_OTHER): Payer: BLUE CROSS/BLUE SHIELD | Admitting: Family Medicine

## 2015-08-26 ENCOUNTER — Encounter: Payer: Self-pay | Admitting: Family Medicine

## 2015-08-26 VITALS — BP 135/85 | HR 84 | Temp 98.3°F | Resp 16 | Wt 184.4 lb

## 2015-08-26 DIAGNOSIS — E063 Autoimmune thyroiditis: Secondary | ICD-10-CM | POA: Diagnosis not present

## 2015-08-26 DIAGNOSIS — J4520 Mild intermittent asthma, uncomplicated: Secondary | ICD-10-CM

## 2015-08-26 DIAGNOSIS — Z23 Encounter for immunization: Secondary | ICD-10-CM

## 2015-08-26 DIAGNOSIS — K219 Gastro-esophageal reflux disease without esophagitis: Secondary | ICD-10-CM

## 2015-08-26 DIAGNOSIS — I10 Essential (primary) hypertension: Secondary | ICD-10-CM

## 2015-08-26 DIAGNOSIS — E038 Other specified hypothyroidism: Secondary | ICD-10-CM

## 2015-08-26 LAB — BASIC METABOLIC PANEL
BUN: 8 mg/dL (ref 7–25)
CHLORIDE: 102 mmol/L (ref 98–110)
CO2: 24 mmol/L (ref 20–31)
Calcium: 9.2 mg/dL (ref 8.6–10.2)
Creat: 0.74 mg/dL (ref 0.50–1.10)
Glucose, Bld: 68 mg/dL (ref 65–99)
Potassium: 3.8 mmol/L (ref 3.5–5.3)
Sodium: 136 mmol/L (ref 135–146)

## 2015-08-26 LAB — CBC
HCT: 39.6 % (ref 36.0–46.0)
Hemoglobin: 13.7 g/dL (ref 12.0–15.0)
MCH: 29.6 pg (ref 26.0–34.0)
MCHC: 34.6 g/dL (ref 30.0–36.0)
MCV: 85.5 fL (ref 78.0–100.0)
MPV: 12 fL (ref 8.6–12.4)
PLATELETS: 304 10*3/uL (ref 150–400)
RBC: 4.63 MIL/uL (ref 3.87–5.11)
RDW: 13.5 % (ref 11.5–15.5)
WBC: 8.6 10*3/uL (ref 4.0–10.5)

## 2015-08-26 LAB — TSH: TSH: 9.893 u[IU]/mL — ABNORMAL HIGH (ref 0.350–4.500)

## 2015-08-26 MED ORDER — LISINOPRIL 10 MG PO TABS: 10.0000 mg | ORAL_TABLET | Freq: Every day | ORAL | Status: DC

## 2015-08-26 MED ORDER — MONTELUKAST SODIUM 10 MG PO TABS: 10.0000 mg | ORAL_TABLET | Freq: Every day | ORAL | Status: DC

## 2015-08-26 MED ORDER — LEVOTHYROXINE SODIUM 100 MCG PO TABS: 100.0000 ug | ORAL_TABLET | Freq: Every day | ORAL | Status: DC

## 2015-08-26 NOTE — Progress Notes (Signed)
   Subjective:    Patient ID: Julia [name], female    DOB: July 27, 1981, 34 y.o.   MRN: [id number]  HPI This is pleasant 34 yo female who presents today for follow up of HTN, hypothyroidism, GERD, asthma. She reports that she is feeling well. Her asthma is well controlled, no wheezing/chest tightness or SOB, rarely uses albuterol.  She takes omeprazole most days (OTC, 20 mg) and has good control of GERD symptoms.   Past Medical History  Diagnosis Date  . Allergy   . Asthma   . Hyperlipidemia   . Hypertension   . Ulcer    Past Surgical History  Procedure Laterality Date  . Appendectomy     Family History  Problem Relation Age of Onset  . Hypertension Mother   . Hypertension Father   . Hypertension Brother   . Asthma Maternal Grandmother   . Hypertension Maternal Grandfather   . Arthritis Paternal Grandmother   . COPD Paternal Grandfather    Social History  Substance Use Topics  . Smoking status: Never Smoker   . Smokeless tobacco: Never Used  . Alcohol Use: No    Review of Systems Per HPI    Objective:   Physical Exam Physical Exam  Constitutional: Oriented to person, place, and time. She appears well-developed and well-nourished.  HENT:  Head: Normocephalic and atraumatic.  Eyes: Conjunctivae are normal.  Neck: Normal range of motion. Neck supple.  Cardiovascular: Normal rate, regular rhythm and normal heart sounds.   Pulmonary/Chest: Effort normal and breath sounds normal.  Musculoskeletal: Normal range of motion.  Neurological: Alert and oriented to person, place, and time.  Skin: Skin is warm and dry.  Psychiatric: Normal mood and affect. Behavior is normal. Judgment and thought content normal.  Vitals reviewed.  BP 135/85 mmHg  Pulse 84  Temp(Src) 98.3 F (36.8 C) (Oral)  Resp 16  Wt 184 lb 6.4 oz (83.643 kg)  LMP 08/18/2015 Wt Readings from Last 3 Encounters:  08/26/15 184 lb 6.4 oz (83.643 kg)  04/14/15 175 lb 12.8 oz (79.742 kg)  02/25/15 179  lb 3.2 oz (81.285 kg)      Assessment & Plan:  1. Asthma, mild intermittent, uncomplicated - well controlled - montelukast (SINGULAIR) 10 MG tablet; Take 1 tablet (10 mg total) by mouth at bedtime.  Dispense: 90 tablet; Refill: 1  2. Essential hypertension - lisinopril (PRINIVIL,ZESTRIL) 10 MG tablet; Take 1 tablet (10 mg total) by mouth daily.  Dispense: 90 tablet; Refill: 1 - CBC - Basic metabolic panel  3. Hypothyroidism due to Hashimoto's thyroiditis - levothyroxine (SYNTHROID, LEVOTHROID) 100 MCG tablet; Take 1 tablet (100 mcg total) by mouth daily.  Dispense: 90 tablet; Refill: 1 - TSH  4. Gastroesophageal reflux disease, esophagitis presence not specified - symptoms well controlled, discussed decreasing dose of omeprazole to 10 mg   5. Flu vaccine need - Flu Vaccine QUAD 36+ mos IM  - follow up 6 months  Olean Reeeborah Gessner, FNP-BC  [facility]  08/26/2015 8:45 AM

## 2015-09-03 ENCOUNTER — Other Ambulatory Visit: Payer: Self-pay | Admitting: Family Medicine

## 2015-09-03 DIAGNOSIS — E034 Atrophy of thyroid (acquired): Secondary | ICD-10-CM

## 2015-09-03 MED ORDER — LEVOTHYROXINE SODIUM 125 MCG PO TABS: 125.0000 ug | ORAL_TABLET | Freq: Every day | ORAL | Status: DC

## 2015-09-04 ENCOUNTER — Telehealth: Payer: Self-pay | Admitting: *Deleted

## 2015-09-04 NOTE — Telephone Encounter (Signed)
Pt.notified

## 2015-09-04 NOTE — Telephone Encounter (Signed)
Lmom for pt to call back  Notes Recorded by Emi Belfasteborah B Gessner, FNP on 09/03/2015 at 8:01 PM Please call patient and let her know I sent in a new dose of her thyroid medication. Please schedule appointment for 3 months to have follow up visit and lab work. Notes Recorded by Johnnette LitterErin M Cardwell, CMA on 09/03/2015 at 9:29 AM Spoke with pt. She take 100mcg qd on an empty stomach.

## 2015-09-04 NOTE — Telephone Encounter (Signed)
-----   Message from Emi Belfasteborah B Gessner, FNP sent at 09/03/2015  8:01 PM EST ----- Please call patient and let her know I sent in a new dose of her thyroid medication. Please schedule appointment for 3 months to have follow up visit and lab work.

## 2015-09-18 ENCOUNTER — Ambulatory Visit (INDEPENDENT_AMBULATORY_CARE_PROVIDER_SITE_OTHER): Payer: BLUE CROSS/BLUE SHIELD | Admitting: Physician Assistant

## 2015-09-18 VITALS — BP 120/76 | HR 92 | Temp 98.0°F | Resp 17 | Ht 63.0 in | Wt 187.0 lb

## 2015-09-18 DIAGNOSIS — J029 Acute pharyngitis, unspecified: Secondary | ICD-10-CM | POA: Diagnosis not present

## 2015-09-18 DIAGNOSIS — J069 Acute upper respiratory infection, unspecified: Secondary | ICD-10-CM

## 2015-09-18 DIAGNOSIS — J019 Acute sinusitis, unspecified: Secondary | ICD-10-CM

## 2015-09-18 LAB — POCT RAPID STREP A (OFFICE): Rapid Strep A Screen: NEGATIVE

## 2015-09-18 MED ORDER — MAGIC MOUTHWASH W/LIDOCAINE: 10.0000 mL | ORAL | Status: DC | PRN

## 2015-09-18 MED ORDER — IPRATROPIUM BROMIDE 0.03 % NA SOLN: 2.0000 | Freq: Two times a day (BID) | NASAL | Status: DC

## 2015-09-18 MED ORDER — AMOXICILLIN-POT CLAVULANATE 875-125 MG PO TABS: 1.0000 | ORAL_TABLET | Freq: Two times a day (BID) | ORAL | Status: AC

## 2015-09-18 MED ORDER — GUAIFENESIN ER 1200 MG PO TB12: 1.0000 | ORAL_TABLET | Freq: Two times a day (BID) | ORAL | Status: DC | PRN

## 2015-09-18 NOTE — Progress Notes (Signed)
[facility] [address] [place] [address] [phone]  Date:  09/18/2015   Name:  Julia [name]   DOB:  11-Nov-1980   MRN:  [id number]  PCP:  Abbe Amsterdam, MD    Chief Complaint: Sinusitis; Cough; Sore Throat; and Headache   History of Present Illness:  This is a 34 y.o. female with PMH hypothyroid, GERD, asthma, HTN, learning disability who is presenting with cough, sore throat and headache x 1 week. Having sinus pain and nasal drainage. Has noticed some wheezing but hasn't had to use her albuterol. Does have a hx of allergies - feels bothered by allergies currently. Denies otalgia, fever, chills. Tried gargling with apple cider vinegar but not helping. Pt states she gets sinus infections occasionally. She takes singulair daily for allergies.  I rx'd pt meds for viral uri to prevent a sinus infection - mother came to speak to me afterwards and states pt does not get over illnesses very well. She really thinks pt needs an antibiotic or she won't get better.  Review of Systems:  Review of Systems See HPI  Patient Active Problem List   Diagnosis Date Noted  . Hypothyroidism due to Hashimoto's thyroiditis 02/25/2015  . GERD (gastroesophageal reflux disease) 02/25/2015  . Asthma 01/20/2013  . Mental handicap 07/05/2012  . HTN (hypertension) 11/14/2011    Prior to Admission medications   Medication Sig Start Date End Date Taking? Authorizing Provider  albuterol (PROVENTIL HFA;VENTOLIN HFA) 108 (90 BASE) MCG/ACT inhaler Inhale 2 puffs into the lungs every 4 (four) hours as needed for wheezing or shortness of breath (cough, shortness of breath or wheezing.). 02/25/15  Yes Todd McVeigh, PA  levothyroxine (SYNTHROID, LEVOTHROID) 125 MCG tablet Take 1 tablet (125 mcg total) by mouth daily. 09/03/15  Yes Emi Belfast, FNP  montelukast (SINGULAIR) 10 MG tablet Take 1 tablet (10 mg total) by mouth at bedtime. 08/26/15  Yes Emi Belfast, FNP   omeprazole (PRILOSEC) 40 MG capsule Take 1 capsule (40 mg total) by mouth daily. Patient taking differently: Take 20 mg by mouth daily.  02/25/15  Yes Raelyn Ensign, PA       Historical Provider, MD       Emi Belfast, FNP    Allergies  Allergen Reactions  . Azithromycin Anaphylaxis    z-pac  . Latex Shortness Of Breath  . Sulfa Antibiotics Shortness Of Breath and Nausea And Vomiting    Past Surgical History  Procedure Laterality Date  . Appendectomy      Social History  Substance Use Topics  . Smoking status: Never Smoker   . Smokeless tobacco: Never Used  . Alcohol Use: No    Family History  Problem Relation Age of Onset  . Hypertension Mother   . Hypertension Father   . Hypertension Brother   . Asthma Maternal Grandmother   . Hypertension Maternal Grandfather   . Arthritis Paternal Grandmother   . COPD Paternal Grandfather     Medication list has been reviewed and updated.  Physical Examination:  Physical Exam  Constitutional: She is oriented to person, place, and time. She appears well-developed and well-nourished. No distress.  HENT:  Head: Normocephalic and atraumatic.  Right Ear: Hearing, tympanic membrane, external ear and ear canal normal.  Left Ear: Hearing, tympanic membrane, external ear and ear canal normal.  Nose: Rhinorrhea present.  Mouth/Throat: Uvula is midline and mucous membranes are normal. Posterior oropharyngeal erythema present. No oropharyngeal exudate or posterior oropharyngeal edema.  Mild bilateral maxillary tenderness  Eyes: Conjunctivae and lids are normal. Right eye exhibits no discharge. Left eye exhibits no discharge. No scleral icterus.  Cardiovascular: Normal rate, regular rhythm, normal heart sounds and normal pulses.   No murmur heard. Pulmonary/Chest: Effort normal and breath sounds normal. No respiratory distress. She has no wheezes. She has no rhonchi. She has no rales.  Musculoskeletal: Normal range of motion.   Lymphadenopathy:       Head (right side): No submental, no submandibular and no tonsillar adenopathy present.       Head (left side): No submental, no submandibular and no tonsillar adenopathy present.    She has no cervical adenopathy.  Neurological: She is alert and oriented to person, place, and time.  Skin: Skin is warm, dry and intact. No lesion and no rash noted.  Psychiatric: She has a normal mood and affect. Her speech is normal and behavior is normal. Thought content normal.    BP 120/76 mmHg  Pulse 122  Temp(Src) 98 F (36.7 C) (Oral)  Resp 17  Ht 5\' 3"  (1.6 m)  Wt 187 lb (84.823 kg)  BMI 33.13 kg/m2  SpO2 98%  LMP 08/18/2015  Results for orders placed or performed in visit on 09/18/15  POCT rapid strep A  Result Value Ref Range   Rapid Strep A Screen Negative Negative   Assessment and Plan:  1. Acute sinusitis, recurrence not specified, unspecified location 2. Sore throat Was originally going to rx meds to treat supportively and have pt call back in 4 days if sx not yet improved. Mother came in afterwards and reported that her daughter does not get over illnesses very well and felt she needed to go ahead and be treated with abx. I complied. augmentin BID x 10 days. Return in 7-10 days if symptoms not improving. - ipratropium (ATROVENT) 0.03 % nasal spray; Place 2 sprays into both nostrils 2 (two) times daily.  Dispense: 30 mL; Refill: 0 - magic mouthwash w/lidocaine SOLN; Take 10 mLs by mouth every 2 (two) hours as needed for mouth pain.  Dispense: 360 mL; Refill: 0 - Guaifenesin (MUCINEX MAXIMUM STRENGTH) 1200 MG TB12; Take 1 tablet (1,200 mg total) by mouth every 12 (twelve) hours as needed.  Dispense: 14 tablet; Refill: 1 - amoxicillin-clavulanate (AUGMENTIN) 875-125 MG tablet; Take 1 tablet by mouth 2 (two) times daily.  Dispense: 20 tablet; Refill: 0 - POCT rapid strep A - Culture, Group A Strep   Roswell MinersNicole V. Dyke BrackettBush, PA-C, MHS [facility]  09/18/2015

## 2015-09-18 NOTE — Progress Notes (Signed)
  Medical screening examination/treatment/procedure(s) were performed by non-physician practitioner and as supervising physician I was immediately available for consultation/collaboration.     

## 2015-09-18 NOTE — Addendum Note (Signed)
Addended by: Carmelina DaneANDERSON, Rio Kidane S on: 09/18/2015 08:26 PM   Modules accepted: Kipp BroodSmartSet

## 2015-09-18 NOTE — Patient Instructions (Signed)
Drink plenty of water (64 oz/day) and get plenty of rest. If you have been prescribed a nasal spray, use twice a day. Take mucinex twice a day. Use mouthwash every couple hours as needed for sore throat. Gargle, do not swallow. Ibuprofen/tylenol can also help If your symptoms are not improving in 4 days, give me a phone call and I will prescribed antibiotics.

## 2015-09-20 LAB — CULTURE, GROUP A STREP: ORGANISM ID, BACTERIA: NORMAL

## 2015-10-24 ENCOUNTER — Encounter: Payer: Self-pay | Admitting: Family Medicine

## 2015-10-29 ENCOUNTER — Encounter: Payer: Self-pay | Admitting: Family Medicine

## 2016-02-27 ENCOUNTER — Ambulatory Visit (INDEPENDENT_AMBULATORY_CARE_PROVIDER_SITE_OTHER): Payer: BLUE CROSS/BLUE SHIELD | Admitting: Emergency Medicine

## 2016-02-27 VITALS — BP 120/80 | HR 100 | Temp 98.9°F | Resp 17 | Ht 63.0 in | Wt 182.0 lb

## 2016-02-27 DIAGNOSIS — E034 Atrophy of thyroid (acquired): Secondary | ICD-10-CM | POA: Diagnosis not present

## 2016-02-27 DIAGNOSIS — E038 Other specified hypothyroidism: Secondary | ICD-10-CM | POA: Diagnosis not present

## 2016-02-27 DIAGNOSIS — J3089 Other allergic rhinitis: Secondary | ICD-10-CM

## 2016-02-27 DIAGNOSIS — J4520 Mild intermittent asthma, uncomplicated: Secondary | ICD-10-CM

## 2016-02-27 DIAGNOSIS — J029 Acute pharyngitis, unspecified: Secondary | ICD-10-CM | POA: Diagnosis not present

## 2016-02-27 DIAGNOSIS — E063 Autoimmune thyroiditis: Secondary | ICD-10-CM

## 2016-02-27 DIAGNOSIS — I10 Essential (primary) hypertension: Secondary | ICD-10-CM | POA: Diagnosis not present

## 2016-02-27 LAB — POCT CBC
GRANULOCYTE PERCENT: 75.1 % (ref 37–80)
HCT, POC: 41.5 % (ref 37.7–47.9)
HEMOGLOBIN: 14.5 g/dL (ref 12.2–16.2)
Lymph, poc: 2 (ref 0.6–3.4)
MCH: 30 pg (ref 27–31.2)
MCHC: 35 g/dL (ref 31.8–35.4)
MCV: 85.9 fL (ref 80–97)
MID (cbc): 1 — AB (ref 0–0.9)
MPV: 10 fL (ref 0–99.8)
POC GRANULOCYTE: 9.2 — AB (ref 2–6.9)
POC LYMPH PERCENT: 16.4 %L (ref 10–50)
POC MID %: 8.5 % (ref 0–12)
Platelet Count, POC: 227 10*3/uL (ref 142–424)
RBC: 4.83 M/uL (ref 4.04–5.48)
RDW, POC: 13.5 %
WBC: 12.3 10*3/uL — AB (ref 4.6–10.2)

## 2016-02-27 LAB — BASIC METABOLIC PANEL WITH GFR
BUN: 8 mg/dL (ref 7–25)
CALCIUM: 9.8 mg/dL (ref 8.6–10.2)
CO2: 26 mmol/L (ref 20–31)
CREATININE: 0.8 mg/dL (ref 0.50–1.10)
Chloride: 103 mmol/L (ref 98–110)
GFR, Est African American: >89 mL/min (ref >=60)
GFR, Est Non African American: >89 mL/min (ref >=60)
Glucose, Bld: 70 mg/dL (ref 65–99)
Potassium: 4 mmol/L (ref 3.5–5.3)
SODIUM: 139 mmol/L (ref 135–146)

## 2016-02-27 LAB — THYROID PANEL WITH TSH
Free Thyroxine Index: 2.4 (ref 1.4–3.8)
T3 UPTAKE: 32 % (ref 22–35)
T4, Total: 7.6 ug/dL (ref 4.5–12.0)
TSH: 0.74 mIU/L

## 2016-02-27 LAB — POCT RAPID STREP A (OFFICE): Rapid Strep A Screen: NEGATIVE

## 2016-02-27 MED ORDER — MONTELUKAST SODIUM 10 MG PO TABS: 10.0000 mg | ORAL_TABLET | Freq: Every day | ORAL | Status: DC

## 2016-02-27 MED ORDER — ALBUTEROL SULFATE HFA 108 (90 BASE) MCG/ACT IN AERS: 2.0000 | INHALATION_SPRAY | RESPIRATORY_TRACT | Status: DC | PRN

## 2016-02-27 MED ORDER — EPINEPHRINE 0.3 MG/0.3ML IJ SOAJ: 0.3000 mg | Freq: Once | INTRAMUSCULAR | Status: DC

## 2016-02-27 MED ORDER — LEVOTHYROXINE SODIUM 125 MCG PO TABS: 125.0000 ug | ORAL_TABLET | Freq: Every day | ORAL | Status: DC

## 2016-02-27 NOTE — Patient Instructions (Signed)
     IF you received an x-ray today, you will receive an invoice from [facility]. Please contact [facility] at [phone] with questions or concerns regarding your invoice.   IF you received labwork today, you will receive an invoice from [facility]. Please contact [facility] at [phone] with questions or concerns regarding your invoice.   Our billing staff will not be able to assist you with questions regarding bills from these companies.  You will be contacted with the lab results as soon as they are available. The fastest way to get your results is to activate your My Chart account. Instructions are located on the last page of this paperwork. If you have not heard from us regarding the results in 2 weeks, please contact this office.      

## 2016-02-27 NOTE — Progress Notes (Addendum)
By signing my name below, I, Raven Small, attest that this documentation has been prepared under the direction and in the presence of Lesle ChrisSteven Daub, MD.  Electronically Signed: Andrew Auaven Small, ED Scribe. 02/27/2016. 12:41 PM.  Chief Complaint:  Chief Complaint  Patient presents with  . Sore Throat  . Medication Refill    synthroid,singulair    HPI: Julia [name] is a 35 y.o. female [facility]. Per pt and family member while eat chicken out at restaurant a couple days ago pt experienced a sudden sore throat. Her family member states that this tends to happen often. This can occur while eating food or while in heavily scented areas such as perfumes at church. When experiencing these reaction pt sometimes feel light headed, nauseous and suddenly ill. She does not have an epi pin. [facility] but did not get a diagnosis.    Pt would like a medical refill of singular and synthroid.  Past Medical History  Diagnosis Date  . Allergy   . Asthma   . Hyperlipidemia   . Hypertension   . Ulcer    Past Surgical History  Procedure Laterality Date  . Appendectomy     Social History   Social History  . Marital Status: Single    Spouse Name: N/A  . Number of Children: N/A  . Years of Education: N/A   Social History Main Topics  . Smoking status: Never Smoker   . Smokeless tobacco: Never Used  . Alcohol Use: No  . Drug Use: No  . Sexual Activity: No   Other Topics Concern  . None   Social History Narrative   Family History  Problem Relation Age of Onset  . Hypertension Mother   . Hypertension Father   . Hypertension Brother   . Asthma Maternal Grandmother   . Hypertension Maternal Grandfather   . Arthritis Paternal Grandmother   . COPD Paternal Grandfather    Allergies  Allergen Reactions  . Azithromycin Anaphylaxis    z-pac  . Latex Shortness Of Breath  . Sulfa Antibiotics  Shortness Of Breath and Nausea And Vomiting   Prior to Admission medications   Medication Sig Start Date End Date Taking? Authorizing Provider  albuterol (PROVENTIL HFA;VENTOLIN HFA) 108 (90 BASE) MCG/ACT inhaler Inhale 2 puffs into the lungs every 4 (four) hours as needed for wheezing or shortness of breath (cough, shortness of breath or wheezing.). 02/25/15  Yes Todd McVeigh, PA  ibuprofen (ADVIL,MOTRIN) 200 MG tablet Take 600 mg by mouth every 6 (six) hours as needed. Reported on 09/18/2015   Yes Historical Provider, MD  ipratropium (ATROVENT) 0.03 % nasal spray Place 2 sprays into both nostrils 2 (two) times daily. 09/18/15  Yes Lanier ClamNicole Bush V, PA-C  levothyroxine (SYNTHROID, LEVOTHROID) 125 MCG tablet Take 1 tablet (125 mcg total) by mouth daily. 09/03/15  Yes Emi Belfasteborah B Gessner, FNP  lisinopril (PRINIVIL,ZESTRIL) 10 MG tablet Take 1 tablet (10 mg total) by mouth daily. 08/26/15  Yes Emi Belfasteborah B Gessner, FNP  montelukast (SINGULAIR) 10 MG tablet Take 1 tablet (10 mg total) by mouth at bedtime. 08/26/15  Yes Emi Belfasteborah B Gessner, FNP  omeprazole (PRILOSEC) 40 MG capsule Take 1 capsule (40 mg total) by mouth daily. Patient taking differently: Take 20 mg by mouth daily.  02/25/15  Yes Todd McVeigh, PA     ROS: The patient denies fevers, chills, night sweats, unintentional weight loss,  chest pain, palpitations, wheezing, dyspnea on exertion, nausea, vomiting, abdominal pain,  hematuria, melena, numbness, weakness, or tingling.   All other systems have been reviewed and were otherwise negative with the exception of those mentioned in the HPI and as above.    PHYSICAL EXAM: Filed Vitals:   02/27/16 1132  BP: 120/80  Pulse: 100  Temp: 98.9 F (37.2 C)  Resp: 17   Body mass index is 32.25 kg/(m^2).   General: Alert, no acute distress HEENT:  Normocephalic, atraumatic, oropharynx patent. Somewhat slow to answer questions but manageable. Mild redness to post oropharynx. Significant enlargement of  the thyroid. Eye: [facility] Cardiovascular:  Regular rate and rhythm, no rubs murmurs or gallops.  No Carotid bruits, radial pulse intact. No pedal edema.  Respiratory: Clear to auscultation bilaterally.  No wheezes, rales, or rhonchi.  No cyanosis, no use of accessory musculature Abdominal: No organomegaly, abdomen is soft and non-tender, positive bowel sounds.  No masses. Musculoskeletal: Gait intact. No edema, tenderness Skin: No rashes. Neurologic: Facial musculature symmetric. Psychiatric: Patient acts appropriately throughout our interaction. Lymphatic: No cervical or submandibular lymphadenopathy   LABS: Results for orders placed or performed in visit on 02/27/16  POCT CBC  Result Value Ref Range   WBC 12.3 (A) 4.6 - 10.2 K/uL   Lymph, poc 2.0 0.6 - 3.4   POC LYMPH PERCENT 16.4 10 - 50 %L   MID (cbc) 1.0 (A) 0 - 0.9   POC MID % 8.5 0 - 12 %M   POC Granulocyte 9.2 (A) 2 - 6.9   Granulocyte percent 75.1 37 - 80 %G   RBC 4.83 4.04 - 5.48 M/uL   Hemoglobin 14.5 12.2 - 16.2 g/dL   HCT, POC 16.1 09.6 - 47.9 %   MCV 85.9 80 - 97 fL   MCH, POC 30.0 27 - 31.2 pg   MCHC 35.0 31.8 - 35.4 g/dL   RDW, POC 04.5 %   Platelet Count, POC 227 142 - 424 K/uL   MPV 10.0 0 - 99.8 fL  POCT rapid strep A  Result Value Ref Range   Rapid Strep A Screen Negative Negative   Meds ordered this encounter  Medications  . EPINEPHrine 0.3 mg/0.3 mL IJ SOAJ injection    Sig: Inject 0.3 mLs (0.3 mg total) into the muscle once.    Dispense:  1 Device    Refill:  5  . montelukast (SINGULAIR) 10 MG tablet    Sig: Take 1 tablet (10 mg total) by mouth at bedtime.    Dispense:  90 tablet    Refill:  1    If pts insurance will not pay for 90d supply please change to #[phone] with 5 RFs  . levothyroxine (SYNTHROID, LEVOTHROID) 125 MCG tablet    Sig: Take 1 tablet (125 mcg total) by mouth daily.    Dispense:  90 tablet    Refill:  1  . albuterol (PROVENTIL HFA;VENTOLIN HFA) 108 (90 Base) MCG/ACT inhaler     Sig: Inhale 2 puffs into the lungs every 4 (four) hours as needed for wheezing or shortness of breath (cough, shortness of breath or wheezing.).    Dispense:  1 Inhaler    Refill:  1    ASSESSMENT/PLAN: White count is slightly elevated but her strep test was negative. I did send off a strep culture. We'll treat this with Zyrtec and refill her Singulair. Also refilled her albuterol inhaler. Referral made back to see Dr. Esbon Callas for her allergies. I did  also give her a prescription for an EpiPen. With her history of swelling and allergic reactions I do not feel she should ever take lisinopril again. Her thyroid medications were refilled and her thyroid levels were checked.I personally performed the services described in this documentation, which was scribed in my presence. The recorded information has been reviewed and is accurate.   Gross sideeffects, risk and benefits, and alternatives of medications d/w patient. Patient is aware that all medications have potential sideeffects and we are unable to predict every sideeffect or drug-drug interaction that may occur.  Lesle Chris MD 02/27/2016 12:36 PM

## 2016-02-29 LAB — CULTURE, GROUP A STREP

## 2016-03-01 ENCOUNTER — Other Ambulatory Visit: Payer: Self-pay | Admitting: Emergency Medicine

## 2016-03-01 DIAGNOSIS — J020 Streptococcal pharyngitis: Secondary | ICD-10-CM

## 2016-03-01 MED ORDER — AMOXICILLIN 500 MG PO CAPS: 500.0000 mg | ORAL_CAPSULE | Freq: Two times a day (BID) | ORAL | Status: DC

## 2016-05-31 ENCOUNTER — Ambulatory Visit (INDEPENDENT_AMBULATORY_CARE_PROVIDER_SITE_OTHER): Payer: BLUE CROSS/BLUE SHIELD | Admitting: Family Medicine

## 2016-05-31 VITALS — BP 120/74 | HR 102 | Temp 98.9°F | Resp 19 | Ht 63.0 in | Wt 180.8 lb

## 2016-05-31 DIAGNOSIS — H6591 Unspecified nonsuppurative otitis media, right ear: Secondary | ICD-10-CM | POA: Diagnosis not present

## 2016-05-31 DIAGNOSIS — J0110 Acute frontal sinusitis, unspecified: Secondary | ICD-10-CM | POA: Diagnosis not present

## 2016-05-31 MED ORDER — AMOXICILLIN-POT CLAVULANATE 875-125 MG PO TABS: 1.0000 | ORAL_TABLET | Freq: Two times a day (BID) | ORAL | 0 refills | Status: DC

## 2016-05-31 NOTE — Patient Instructions (Addendum)
Start with food  . amoxicillin-clavulanate (AUGMENTIN) 875-125 MG tablet    Sig: Take 1 tablet by mouth 2 (two) times daily.   May take acetaminophen (Tylenol) 650 mg for pain as needed.  Follow-up as needed.  IF you received an x-ray today, you will receive an invoice from [facility]. Please contact [facility] at [phone] with questions or concerns regarding your invoice.   IF you received labwork today, you will receive an invoice from [employer]. Please contact [facility] at [phone] with questions or concerns regarding your invoice.   Our billing staff will not be able to assist you with questions regarding bills from these companies.  You will be contacted with the lab results as soon as they are available. The fastest way to get your results is to activate your My Chart account. Instructions are located on the last page of this paperwork. If you have not heard from [place] regarding the results in 2 weeks, please contact this office.   Otitis Media With Effusion Otitis media with effusion is the presence of fluid in the middle ear. This is a common problem in children, which often follows ear infections. It may be present for weeks or longer after the infection. Unlike an acute ear infection, otitis media with effusion refers only to fluid behind the ear drum and not infection. Children with repeated ear and sinus infections and allergy problems are the most likely to get otitis media with effusion. CAUSES  The most frequent cause of the fluid buildup is dysfunction of the eustachian tubes. These are the tubes that drain fluid in the ears to the back of the nose (nasopharynx). SYMPTOMS   The main symptom of this condition is hearing loss. As a result, you or your child may:  Listen to the TV at a loud volume.  Not respond to questions.  Ask "what" often when spoken to.  Mistake or confuse one sound or word for another.  There may  be a sensation of fullness or pressure but usually not pain. DIAGNOSIS   Your health care provider will diagnose this condition by examining you or your child's ears.  Your health care provider may test the pressure in you or your child's ear with a tympanometer.  A hearing test may be conducted if the problem persists. TREATMENT   Treatment depends on the duration and the effects of the effusion.  Antibiotics, decongestants, nose drops, and cortisone-type drugs (tablets or nasal spray) may not be helpful.  Children with persistent ear effusions may have delayed language or behavioral problems. Children at risk for developmental delays in hearing, learning, and speech may require referral to a specialist earlier than children not at risk.  You or your child's health care provider may suggest a referral to an ear, nose, and throat surgeon for treatment. The following may help restore normal hearing:  Drainage of fluid.  Placement of ear tubes (tympanostomy tubes).  Removal of adenoids (adenoidectomy). HOME CARE INSTRUCTIONS   Avoid secondhand smoke.  Infants who are breastfed are less likely to have this condition.  Avoid feeding infants while they are lying flat.  Avoid known environmental allergens.  Avoid people who are sick. SEEK MEDICAL CARE IF:   Hearing is not better in 3 months.  Hearing is worse.  Ear pain.  Drainage from the ear.  Dizziness. MAKE SURE YOU:   Understand these instructions.  Will watch your condition.  Will get help right away if you are not doing  well or get worse.   This information is not intended to replace advice given to you by your health care provider. Make sure you discuss any questions you have with your health care provider.   Document Released: 10/28/2004 Document Revised: 10/11/2014 Document Reviewed: 04/17/2013 Elsevier Interactive Patient Education [employer]2016 [employer].

## 2016-05-31 NOTE — Progress Notes (Signed)
Patient ID: Julia [name], female    DOB: [birth year], 35 y.o.   MRN: [id number]  PCP: Abbe Amsterdam, MD  Chief Complaint  Patient presents with  . Dizziness    nasal congestion, sinus pressure since Wednesday     Subjective:   HPI Presents for evaluation of sinus pressure x 6 days.  36 year old patient presents today with complaints of sinus pressure and ear pain for 6 days. She reports the sensation of ear pressure and has experienced some dizziness since last week. She reports a temperature of 98.2 which is afebrile. He also reports some cough which was nonproductive and sneezing. She is followed by allergist and takes regularly Rhinocort and Xyzal.  Since he'll most begin she has taken a combination of Sudafed Afrin and ibuprofen with minimal relief. She reports a feeling of fatigue, headache, predominately in her forehead region, and denies nausea or vomiting.  Review of Systems  Constitutional: Positive for fatigue. Negative for fever.  HENT: Positive for congestion, ear pain, rhinorrhea and sinus pressure.   Respiratory: Positive for cough.   Cardiovascular: Negative.   Neurological: Positive for headaches.  See HPI     Patient Active Problem List   Diagnosis Date Noted  . Hypothyroidism due to Hashimoto's thyroiditis 02/25/2015  . GERD (gastroesophageal reflux disease) 02/25/2015  . Asthma 01/20/2013  . Mental handicap 07/05/2012  . HTN (hypertension) 11/14/2011     Prior to Admission medications   Medication Sig Start Date End Date Taking? Authorizing Provider  albuterol (PROVENTIL HFA;VENTOLIN HFA) 108 (90 Base) MCG/ACT inhaler Inhale 2 puffs into the lungs every 4 (four) hours as needed for wheezing or shortness of breath (cough, shortness of breath or wheezing.). 02/27/16  Yes Collene Gobble, MD  budesonide (RHINOCORT ALLERGY) 32 MCG/ACT nasal spray Place into both nostrils daily.   Yes Historical Provider, MD  EPINEPHrine 0.3 mg/0.3 mL IJ SOAJ injection  Inject 0.3 mLs (0.3 mg total) into the muscle once. 02/27/16  Yes Collene Gobble, MD  ibuprofen (ADVIL,MOTRIN) 200 MG tablet Take 600 mg by mouth every 6 (six) hours as needed. Reported on 09/18/2015   Yes Historical Provider, MD  levothyroxine (SYNTHROID, LEVOTHROID) 125 MCG tablet Take 1 tablet (125 mcg total) by mouth daily. 02/27/16  Yes Collene Gobble, MD  montelukast (SINGULAIR) 10 MG tablet Take 1 tablet (10 mg total) by mouth at bedtime. 02/27/16  Yes Collene Gobble, MD  omeprazole (PRILOSEC) 20 MG capsule Take 20 mg by mouth as needed.   Yes Historical Provider, MD     Allergies  Allergen Reactions  . Azithromycin Anaphylaxis    z-pac  . Latex Shortness Of Breath  . Sulfa Antibiotics Shortness Of Breath and Nausea And Vomiting  . Lisinopril     Recurrent episodes of allergic reactions possibly some angioedema she should not be on this medication.       Objective:  Physical Exam  Constitutional: She is oriented to person, place, and time. She appears well-developed and well-nourished.  HENT:  Right ear erythema anterior to TM. Visible effusion with purulent visible fluid. Tenderness noted with palpation of maxillary and frontal sinues.  Cardiovascular: Normal rate, regular rhythm and normal heart sounds.   Pulmonary/Chest: Effort normal and breath sounds normal.  Musculoskeletal: Normal range of motion.  Neurological: She is alert and oriented to person, place, and time.  Skin: Skin is warm and dry.   . Vitals:   05/31/16 1636  BP: 120/74  Pulse: (!) 102  Resp: 19  Temp: 98.9 F (37.2 C)     Assessment & Plan:  1. Otitis media with effusion, right with visible suppurative changes noted. Marland Kitchen. amoxicillin-clavulanate (AUGMENTIN) 875-125 MG tablet    Sig: Take 1 tablet by mouth 2 (two) times daily.     2. Acute frontal sinusitis, recurrence not specified, treat symptoms of congestion and pressure . budesonide (RHINOCORT ALLERGY) 32 MCG/ACT nasal spray    Sig: Place into  both nostrils daily.   Follow-up as needed.  Godfrey PickKimberly S. Tiburcio PeaHarris, MSN, FNP-C [facility]

## 2016-06-09 ENCOUNTER — Telehealth: Payer: Self-pay

## 2016-06-09 NOTE — Telephone Encounter (Signed)
Patient have two pills left of the antibiotic. Patient is still having symptoms of discomfort and dizziness. Patient is complaining of ear pain. She feels hot and sore throat. [phone].

## 2016-06-10 ENCOUNTER — Ambulatory Visit (INDEPENDENT_AMBULATORY_CARE_PROVIDER_SITE_OTHER): Payer: BLUE CROSS/BLUE SHIELD | Admitting: Physician Assistant

## 2016-06-10 VITALS — BP 119/79 | HR 77 | Temp 97.7°F | Resp 16 | Ht 63.5 in | Wt 183.8 lb

## 2016-06-10 DIAGNOSIS — H6983 Other specified disorders of Eustachian tube, bilateral: Secondary | ICD-10-CM | POA: Diagnosis not present

## 2016-06-10 DIAGNOSIS — R42 Dizziness and giddiness: Secondary | ICD-10-CM

## 2016-06-10 LAB — POCT CBC
GRANULOCYTE PERCENT: 73.2 % (ref 37–80)
HEMATOCRIT: 40.9 % (ref 37.7–47.9)
HEMOGLOBIN: 14.4 g/dL (ref 12.2–16.2)
Lymph, poc: 2.4 (ref 0.6–3.4)
MCH: 30.4 pg (ref 27–31.2)
MCHC: 35.3 g/dL (ref 31.8–35.4)
MCV: 86 fL (ref 80–97)
MID (cbc): 0.5 (ref 0–0.9)
MPV: 10 fL (ref 0–99.8)
POC GRANULOCYTE: 7.8 — AB (ref 2–6.9)
POC LYMPH PERCENT: 22 %L (ref 10–50)
POC MID %: 4.8 % (ref 0–12)
Platelet Count, POC: 251 10*3/uL (ref 142–424)
RBC: 4.75 M/uL (ref 4.04–5.48)
RDW, POC: 13.2 %
WBC: 10.7 10*3/uL — AB (ref 4.6–10.2)

## 2016-06-10 MED ORDER — PREDNISONE 20 MG PO TABS: 40.0000 mg | ORAL_TABLET | Freq: Every day | ORAL | 0 refills | Status: DC

## 2016-06-10 NOTE — Progress Notes (Signed)
This chart was scribed for Trena Platt PA-C, by [employer], at [facility].  This patient was seen in room 14  and the patient's care was started at 11:43 AM.   [facility] [address], [place] [address] [phone]  Date:  06/10/2016   Name:  Julia [name]   DOB:  11-Mar-1981   MRN:  [id number]  PCP:  Abbe Amsterdam, MD   Chief Complaint  Patient presents with   Follow-up    for ear pain - don't feel better   Sore Throat   History of Present Illness:  Julia [name] is a 35 y.o. female patient who presents to [facility]  for a follow up regarding her bilateral ear pain which she was seen for on 05/31/2016.  Patient was given Augmentin and Rhinocort for otitis media and effusion at that time (which she has been compliant with).  She has associated symptoms of feeling warm and still has bilateral ear pain with a sore throat.  She is also complaining of dizziness when she is up moving around or doing things like brushing her teeth or washing her hands.  She has increased dizziness going around corners and has been having to hold onto things in order to ambulate without getting dizzy.  Patient is taking Xyzal (night time- since July of this year) and Singulair.  She also uses Rhinocort at night.  Patient has been using Sudafed which helps her at night time.  Patient last went to an ENT about 1 year ago. Patient has been on antibiotic ear drops in the past which her mother states worked really well for her.   Patients menstrual cycles are "slow starting" and lasts for about 5 days.  She has cramping for about a week before her period actually starts.  Patient is not sexually active.   Patient comes in about every 6 months to have blood drawn for her thyroid tests.     Patient Active Problem List   Diagnosis Date Noted   Hypothyroidism due to Hashimoto's thyroiditis 02/25/2015   GERD (gastroesophageal reflux disease)  02/25/2015   Asthma 01/20/2013   Mental handicap 07/05/2012   HTN (hypertension) 11/14/2011    Past Medical History:  Diagnosis Date   Allergy    Asthma    Hyperlipidemia    Hypertension    Ulcer     Past Surgical History:  Procedure Laterality Date   APPENDECTOMY      Social History  Substance Use Topics   Smoking status: Never Smoker   Smokeless tobacco: Never Used   Alcohol use No    Family History  Problem Relation Age of Onset   Hypertension Mother    Hypertension Father    Hypertension Brother    Asthma Maternal Grandmother    Hypertension Maternal Grandfather    Arthritis Paternal Grandmother    COPD Paternal Grandfather     Allergies  Allergen Reactions   Azithromycin Anaphylaxis    z-pac   Latex Shortness Of Breath   Sulfa Antibiotics Shortness Of Breath and Nausea And Vomiting   Lisinopril     Recurrent episodes of allergic reactions possibly some angioedema she should not be on this medication.    Medication list has been reviewed and updated.  Current Outpatient Prescriptions on File Prior to Visit  Medication Sig Dispense Refill   albuterol (PROVENTIL HFA;VENTOLIN HFA) 108 (90 Base) MCG/ACT inhaler Inhale 2 puffs into the lungs every 4 (four) hours  as needed for wheezing or shortness of breath (cough, shortness of breath or wheezing.). 1 Inhaler 1   budesonide (RHINOCORT ALLERGY) 32 MCG/ACT nasal spray Place into both nostrils daily.     EPINEPHrine 0.3 mg/0.3 mL IJ SOAJ injection Inject 0.3 mLs (0.3 mg total) into the muscle once. 1 Device 5   ibuprofen (ADVIL,MOTRIN) 200 MG tablet Take 600 mg by mouth every 6 (six) hours as needed. Reported on 09/18/2015     levothyroxine (SYNTHROID, LEVOTHROID) 125 MCG tablet Take 1 tablet (125 mcg total) by mouth daily. 90 tablet 1   montelukast (SINGULAIR) 10 MG tablet Take 1 tablet (10 mg total) by mouth at bedtime. 90 tablet 1   omeprazole (PRILOSEC) 20 MG capsule Take 20 mg  by mouth as needed.     amoxicillin-clavulanate (AUGMENTIN) 875-125 MG tablet Take 1 tablet by mouth 2 (two) times daily. (Patient not taking: Reported on 06/10/2016) 20 tablet 0   No current facility-administered medications on file prior to visit.     Review of Systems  HENT: Positive for ear pain and sore throat.   Neurological: Positive for dizziness.   ROS otherwise unremarkable unless listed above.   Physical Examination: BP 119/79 (BP Location: Right Arm, Patient Position: Sitting, Cuff Size: Large)    Pulse 77    Temp 97.7 F (36.5 C) (Oral)    Resp 16    Ht 5' 3.5" (1.613 m)    Wt 183 lb 12.8 oz (83.4 kg)    LMP 05/17/2016    SpO2 100%    BMI 32.05 kg/m  Ideal Body Weight: @FLOWAMB ([id number])@([phone])@  Physical Exam  Constitutional: She is oriented to person, place, and time. She appears well-developed and well-nourished. No distress.  HENT:  Head: Normocephalic and atraumatic.  Right Ear: External ear normal.  Left Ear: External ear normal.  AC greater than BC.  Clear effusion without bulging or erythema.  Throat was normal without erythema, exudate or edema.   Eyes: Conjunctivae and EOM are normal. Pupils are equal, round, and reactive to light.  dix- hallpike maneuver generalized nystagmus not dominant on a specific side.   Cardiovascular: Normal rate.   Pulmonary/Chest: Effort normal. No respiratory distress.  Neurological: She is alert and oriented to person, place, and time.  Skin: She is not diaphoretic.  Psychiatric: She has a normal mood and affect. Her behavior is normal.   Results for orders placed or performed in visit on 06/10/16  POCT CBC  Result Value Ref Range   WBC 10.7 (A) 4.6 - 10.2 K/uL   Lymph, poc 2.4 0.6 - 3.4   POC LYMPH PERCENT 22.0 10 - 50 %L   MID (cbc) 0.5 0 - 0.9   POC MID % 4.8 0 - 12 %M   POC Granulocyte 7.8 (A) 2 - 6.9   Granulocyte percent 73.2 37 - 80 %G   RBC 4.75 4.04 - 5.48 M/uL   Hemoglobin 14.4 12.2 - 16.2 g/dL   HCT, POC 09.840.9  11.937.7 - 47.9 %   MCV 86.0 80 - 97 fL   MCH, POC 30.4 27 - 31.2 pg   MCHC 35.3 31.8 - 35.4 g/dL   RDW, POC 14.713.2 %   Platelet Count, POC 251 142 - 424 K/uL   MPV 10.0 0 - 99.8 fL   Orthostatic VS for the past 24 hrs (Last 3 readings):  BP- Lying Pulse- Lying BP- Sitting Pulse- Sitting BP- Standing at 0 minutes Pulse- Standing at 0 minutes BP- Standing at 3  minutes Pulse- Standing at 3 minutes  06/10/16 1214 (!) 127/91 73 124/88 97 116/88 96 (!) 120/91 97     Assessment and Plan: Julia [name] is a 35 y.o. female who is here today for cc of sinus issues, and dizziness. Possible bppv.  Likely not orthostatics.  I am advising short prednisone taper, and continue meds.  She will rtc as needed.  Also advised to follow up with her ent specialist.  Patient and mother voiced understanding.  Vertigo - Plan: POCT CBC, COMPLETE METABOLIC PANEL WITH GFR, Orthostatic vital signs, predniSONE (DELTASONE) 20 MG tablet  Eustachian tube dysfunction, bilateral - Plan: predniSONE (DELTASONE) 20 MG tablet  Trena Platt, PA-C [facility] 9/18/20175:16 PM  Trena Platt, PA-C [facility] 06/10/2016 11:37 AM

## 2016-06-10 NOTE — Patient Instructions (Addendum)
Please get the meclizine over the counter.  If you can not obtain this, we can order this.  I would like you to take 25mg  three times per day as needed for the dysequilibrium.   At this time, we can do the rhinocort 2 sprays daily instead of one.      IF you received an x-ray today, you will receive an invoice from [facility]. Please contact [facility] at [phone] with questions or concerns regarding your invoice.   IF you received labwork today, you will receive an invoice from [facility]. Please contact [facility] at [phone] with questions or concerns regarding your invoice.   Our billing staff will not be able to assist you with questions regarding bills from these companies.  You will be contacted with the lab results as soon as they are available. The fastest way to get your results is to activate your My Chart account. Instructions are located on the last page of this paperwork. If you have not heard from us regarding the results in 2 weeks, please contact this office.

## 2016-06-11 LAB — COMPLETE METABOLIC PANEL WITH GFR
ALBUMIN: 4.7 g/dL (ref 3.6–5.1)
ALK PHOS: 84 U/L (ref 33–115)
ALT: 27 U/L (ref 6–29)
AST: 28 U/L (ref 10–30)
BUN: 7 mg/dL (ref 7–25)
CALCIUM: 10 mg/dL (ref 8.6–10.2)
CO2: 24 mmol/L (ref 20–31)
Chloride: 102 mmol/L (ref 98–110)
Creat: 0.82 mg/dL (ref 0.50–1.10)
GFR, Est African American: >89 mL/min (ref >=60)
GLUCOSE: 87 mg/dL (ref 65–99)
POTASSIUM: 4.1 mmol/L (ref 3.5–5.3)
SODIUM: 138 mmol/L (ref 135–146)
Total Bilirubin: 0.3 mg/dL (ref 0.2–1.2)
Total Protein: 8.4 g/dL — ABNORMAL HIGH (ref 6.1–8.1)

## 2016-06-11 NOTE — Telephone Encounter (Signed)
Pt came in to be seen

## 2016-09-06 ENCOUNTER — Ambulatory Visit (INDEPENDENT_AMBULATORY_CARE_PROVIDER_SITE_OTHER): Payer: BLUE CROSS/BLUE SHIELD | Admitting: Family Medicine

## 2016-09-06 VITALS — BP 122/72 | HR 96 | Temp 99.0°F | Resp 17 | Ht 63.0 in | Wt 189.0 lb

## 2016-09-06 DIAGNOSIS — J4520 Mild intermittent asthma, uncomplicated: Secondary | ICD-10-CM

## 2016-09-06 DIAGNOSIS — E038 Other specified hypothyroidism: Secondary | ICD-10-CM

## 2016-09-06 DIAGNOSIS — R0981 Nasal congestion: Secondary | ICD-10-CM

## 2016-09-06 DIAGNOSIS — J3089 Other allergic rhinitis: Secondary | ICD-10-CM

## 2016-09-06 DIAGNOSIS — E063 Autoimmune thyroiditis: Secondary | ICD-10-CM | POA: Diagnosis not present

## 2016-09-06 MED ORDER — DOXYCYCLINE HYCLATE 100 MG PO CAPS: 100.0000 mg | ORAL_CAPSULE | Freq: Two times a day (BID) | ORAL | 0 refills | Status: DC

## 2016-09-06 MED ORDER — MUCINEX DM MAXIMUM STRENGTH 60-1200 MG PO TB12: 1.0000 | ORAL_TABLET | Freq: Two times a day (BID) | ORAL | 10 refills | Status: DC

## 2016-09-06 MED ORDER — ALBUTEROL SULFATE HFA 108 (90 BASE) MCG/ACT IN AERS: 2.0000 | INHALATION_SPRAY | RESPIRATORY_TRACT | 1 refills | Status: DC | PRN

## 2016-09-06 MED ORDER — MONTELUKAST SODIUM 10 MG PO TABS
10.0000 mg | ORAL_TABLET | Freq: Every day | ORAL | 1 refills | Status: DC
Start: 2016-09-06 — End: 2017-05-16

## 2016-09-06 NOTE — Patient Instructions (Addendum)
Continue Rhinocort, Singular, and Xyzal for symptom management. Add Mucinex DM 1200 mg twice daily until symptoms improve. I've refilled your Synthroid. I will follow-up with you regarding your lab results.  If symptoms have not improved by Saturday, you may fill antibioic prescription.  IF you received an x-ray today, you will receive an invoice from [facility]. Please contact [facility] at [phone] with questions or concerns regarding your invoice.   IF you received labwork today, you will receive an invoice from [facility]. Please contact [facility] at [phone] with questions or concerns regarding your invoice.   Our billing staff will not be able to assist you with questions regarding bills from these companies.  You will be contacted with the lab results as soon as they are available. The fastest way to get your results is to activate your My Chart account. Instructions are located on the last page of this paperwork. If you have not heard from us regarding the results in 2 weeks, please contact this office.     Upper Respiratory Infection, Adult Most upper respiratory infections (URIs) are caused by a virus. A URI affects the nose, throat, and upper air passages. The most common type of URI is often called "the common cold." Follow these instructions at home:  Take medicines only as told by your doctor.  Gargle warm saltwater or take cough drops to comfort your throat as told by your doctor.  Use a warm mist humidifier or inhale steam from a shower to increase air moisture. This may make it easier to breathe.  Drink enough fluid to keep your pee (urine) clear or pale yellow.  Eat soups and other clear broths.  Have a healthy diet.  Rest as needed.  Go back to work when your fever is gone or your doctor says it is okay.  You may need to stay home longer to avoid giving your URI to others.  You can also wear a face mask  and wash your hands often to prevent spread of the virus.  Use your inhaler more if you have asthma.  Do not use any tobacco products, including cigarettes, chewing tobacco, or electronic cigarettes. If you need help quitting, ask your doctor. Contact a doctor if:  You are getting worse, not better.  Your symptoms are not helped by medicine.  You have chills.  You are getting more short of breath.  You have brown or red mucus.  You have yellow or brown discharge from your nose.  You have pain in your face, especially when you bend forward.  You have a fever.  You have puffy (swollen) neck glands.  You have pain while swallowing.  You have white areas in the back of your throat. Get help right away if:  You have very bad or constant:  Headache.  Ear pain.  Pain in your forehead, behind your eyes, and over your cheekbones (sinus pain).  Chest pain.  You have long-lasting (chronic) lung disease and any of the following:  Wheezing.  Long-lasting cough.  Coughing up blood.  A change in your usual mucus.  You have a stiff neck.  You have changes in your:  Vision.  Hearing.  Thinking.  Mood. This information is not intended to replace advice given to you by your health care provider. Make sure you discuss any questions you have with your health care provider. Document Released: 03/08/2008 Document Revised: 05/23/2016 Document Reviewed: 12/26/2013 Elsevier Interactive Patient Education  2017 [employer]

## 2016-09-06 NOTE — Progress Notes (Signed)
Patient ID: Elder LoveChristine R Trani, female    DOB: 1981-08-26, 35 y.o.   MRN: [id number]  PCP: Abbe AmsterdamOPLAND,JESSICA, MD  Chief Complaint  Patient presents with  . Sinusitis  . Medication Refill    synthroid,singulair     Subjective:   HPI 35 year old female presents for evaluation of sinus congestion, and medication refills of Synthroid and Singular. [facility]   Sinus Congestion Sinus congestion x 2 weeks. Reports sensation of pressure, congestion on left side of face and nose. Left sided headache and eyes watering. Denies current or prior cough. No fever or fatigue. Took an over the counter emergency pack and decongestants medication with mild relief of symptoms. Hx. of Asthma.Reports mild wheezing since the weather has turned cold. Requesting a refill of her Singular.   Hypothyroidism Denies hair loss or thinning, weight gain, or fatigue. Reports compliance with medication.   Social History   Social History  . Marital status: Single    Spouse name: N/A  . Number of children: N/A  . Years of education: N/A   Occupational History  . Not on file.   Social History Main Topics  . Smoking status: Never Smoker  . Smokeless tobacco: Never Used  . Alcohol use No  . Drug use: No  . Sexual activity: No   Other Topics Concern  . Not on file   Social History Narrative  . No narrative on file   Review of Systems See HPI  Patient Active Problem List   Diagnosis Date Noted  . Hypothyroidism due to Hashimoto's thyroiditis 02/25/2015  . GERD (gastroesophageal reflux disease) 02/25/2015  . Asthma 01/20/2013  . Mental handicap 07/05/2012  . HTN (hypertension) 11/14/2011     Prior to Admission medications   Medication Sig Start Date End Date Taking? Authorizing Provider  albuterol (PROVENTIL HFA;VENTOLIN HFA) 108 (90 Base) MCG/ACT inhaler Inhale 2 puffs into the lungs every 4 (four) hours as needed for wheezing or shortness of breath (cough, shortness of  breath or wheezing.). 02/27/16  Yes Collene GobbleSteven A Daub, MD  budesonide (RHINOCORT ALLERGY) 32 MCG/ACT nasal spray Place into both nostrils daily.   Yes Historical Provider, MD  EPINEPHrine 0.3 mg/0.3 mL IJ SOAJ injection Inject 0.3 mLs (0.3 mg total) into the muscle once. 02/27/16  Yes Collene GobbleSteven A Daub, MD  ibuprofen (ADVIL,MOTRIN) 200 MG tablet Take 600 mg by mouth every 6 (six) hours as needed. Reported on 09/18/2015   Yes Historical Provider, MD  levothyroxine (SYNTHROID, LEVOTHROID) 125 MCG tablet Take 1 tablet (125 mcg total) by mouth daily. 02/27/16  Yes Collene GobbleSteven A Daub, MD  montelukast (SINGULAIR) 10 MG tablet Take 1 tablet (10 mg total) by mouth at bedtime. 02/27/16  Yes Collene GobbleSteven A Daub, MD  omeprazole (PRILOSEC) 20 MG capsule Take 20 mg by mouth as needed.   Yes Historical Provider, MD     Allergies  Allergen Reactions  . Azithromycin Anaphylaxis    z-pac  . Latex Shortness Of Breath  . Sulfa Antibiotics Shortness Of Breath and Nausea And Vomiting  . Lisinopril     Recurrent episodes of allergic reactions possibly some angioedema she should not be on this medication.     Objective:  Physical Exam  Constitutional: She is oriented to person, place, and time. She appears well-developed and well-nourished.  HENT:  Head: Normocephalic and atraumatic.  Right Ear: External ear normal.  Left Ear: External ear normal.  Nose: Mucosal edema and rhinorrhea present.  Mouth/Throat: Oropharynx is clear  and moist.  Eyes: Conjunctivae are normal. Pupils are equal, round, and reactive to light.  Neck: Normal range of motion. Neck supple.  Cardiovascular: Normal rate, regular rhythm, normal heart sounds and intact distal pulses.   Pulmonary/Chest: Effort normal and breath sounds normal.  Musculoskeletal: Normal range of motion.  Lymphadenopathy:    She has no cervical adenopathy.  Neurological: She is alert and oriented to person, place, and time.  Skin: Skin is warm and dry.  Psychiatric: She has a  normal mood and affect. Her behavior is normal. Judgment and thought content normal.     Vitals:   09/06/16 1012  BP: 122/72  Pulse: 96  Resp: 17  Temp: 99 F (37.2 C)    Assessment & Plan:  1. Sinus congestion 2. Mild intermittent asthma without complication 3. Hypothyroidism due to Hashimoto's thyroiditis 4. Other allergic rhinitis  -Continue Rhinocort, Singular, Xyzal -Thyroid Panel pending. Will adjust Levothyroxine dosage if indicated. -Doxycyline 100 mg, 2 times daily x 10 days fill if symptoms have not improved by Saturday. -Mucinex DM 1200 MG twice daily until symptoms improve.   Godfrey PickKimberly S. Tiburcio PeaHarris, MSN, FNP-C [facility]

## 2016-09-07 LAB — THYROID PANEL WITH TSH
Free Thyroxine Index: 2.4 (ref 1.2–4.9)
T3 Uptake Ratio: 27 % (ref 24–39)
T4, Total: 9 ug/dL (ref 4.5–12.0)
TSH: 7.38 u[IU]/mL — ABNORMAL HIGH (ref 0.450–4.500)

## 2016-11-15 ENCOUNTER — Other Ambulatory Visit: Payer: Self-pay | Admitting: Emergency Medicine

## 2016-11-15 DIAGNOSIS — E034 Atrophy of thyroid (acquired): Secondary | ICD-10-CM

## 2017-03-05 ENCOUNTER — Ambulatory Visit: Payer: BLUE CROSS/BLUE SHIELD | Admitting: Physician Assistant

## 2017-05-16 ENCOUNTER — Encounter: Payer: Self-pay | Admitting: Physician Assistant

## 2017-05-16 ENCOUNTER — Ambulatory Visit (INDEPENDENT_AMBULATORY_CARE_PROVIDER_SITE_OTHER): Payer: BLUE CROSS/BLUE SHIELD | Admitting: Physician Assistant

## 2017-05-16 DIAGNOSIS — E034 Atrophy of thyroid (acquired): Secondary | ICD-10-CM | POA: Diagnosis not present

## 2017-05-16 DIAGNOSIS — J3089 Other allergic rhinitis: Secondary | ICD-10-CM | POA: Diagnosis not present

## 2017-05-16 MED ORDER — MONTELUKAST SODIUM 10 MG PO TABS: 10.0000 mg | ORAL_TABLET | Freq: Every day | ORAL | 3 refills | Status: DC

## 2017-05-16 MED ORDER — LEVOTHYROXINE SODIUM 125 MCG PO TABS: 125.0000 ug | ORAL_TABLET | Freq: Every day | ORAL | 1 refills | Status: DC

## 2017-05-16 NOTE — Progress Notes (Signed)
PRIMARY CARE AT [facility] [address] [place] [address] [phone]  Date:  05/16/2017   Name:  Julia [name]   DOB:  [birth year]   MRN:  [id number]  PCP:  Pearline Cables, MD    History of Present Illness:  Julia [name] is a 36 y.o. female patient who presents to PCP with  Chief Complaint  Patient presents with  . Medication Refill    synthroid and singulair     Her allergies are better controlled with the singulair.  Thyroid: no side effects to the synthroid.  She is taking compliantly.  No chest pains, constipation, diarrhea, fatigue, change in skin, or hair.  She is doing well with her allergy medication.  Rare congestion.  Taking dialy.  Patient Active Problem List   Diagnosis Date Noted  . Hypothyroidism due to Hashimoto's thyroiditis 02/25/2015  . GERD (gastroesophageal reflux disease) 02/25/2015  . Asthma 01/20/2013  . Mental handicap 07/05/2012  . HTN (hypertension) 11/14/2011    Past Medical History:  Diagnosis Date  . Allergy   . Asthma   . Hyperlipidemia   . Hypertension   . Ulcer     Past Surgical History:  Procedure Laterality Date  . APPENDECTOMY      Social History  Substance Use Topics  . Smoking status: Never Smoker  . Smokeless tobacco: Never Used  . Alcohol use No    Family History  Problem Relation Age of Onset  . Hypertension Mother   . Hypertension Father   . Hypertension Brother   . Asthma Maternal Grandmother   . Hypertension Maternal Grandfather   . Arthritis Paternal Grandmother   . COPD Paternal Grandfather     Allergies  Allergen Reactions  . Azithromycin Anaphylaxis    z-pac  . Latex Shortness Of Breath  . Sulfa Antibiotics Shortness Of Breath and Nausea And Vomiting  . Lisinopril     Recurrent episodes of allergic reactions possibly some angioedema she should not be on this medication.    Medication list has been reviewed and updated.  Current Outpatient Prescriptions on File Prior to Visit   Medication Sig Dispense Refill  . albuterol (PROVENTIL HFA;VENTOLIN HFA) 108 (90 Base) MCG/ACT inhaler Inhale 2 puffs into the lungs every 4 (four) hours as needed for wheezing or shortness of breath (cough, shortness of breath or wheezing.). 1 Inhaler 1  . budesonide (RHINOCORT ALLERGY) 32 MCG/ACT nasal spray Place into both nostrils daily.    Marland Kitchen EPINEPHrine 0.3 mg/0.3 mL IJ SOAJ injection Inject 0.3 mLs (0.3 mg total) into the muscle once. 1 Device 5  . ibuprofen (ADVIL,MOTRIN) 200 MG tablet Take 600 mg by mouth every 6 (six) hours as needed. Reported on 09/18/2015    . levothyroxine (SYNTHROID, LEVOTHROID) 125 MCG tablet TAKE 1 TABLET BY MOUTH DAILY 90 tablet 1  . montelukast (SINGULAIR) 10 MG tablet Take 1 tablet (10 mg total) by mouth at bedtime. 90 tablet 1  . omeprazole (PRILOSEC) 20 MG capsule Take 20 mg by mouth as needed.     No current facility-administered medications on file prior to visit.     ROS ROS otherwise unremarkable unless listed above.  Physical Examination: BP 138/90   Pulse 77   Temp 98.2 F (36.8 C) (Oral)   Resp 16   Ht 5\' 3"  (1.6 m)   Wt 185 lb (83.9 kg)   LMP 05/13/2017   SpO2 96%   BMI 32.77 kg/m  Ideal Body Weight: Weight in (lb) to have  BMI = 25: 140.8  Physical Exam  Constitutional: She is oriented to person, place, and time. She appears well-developed and well-nourished. No distress.  HENT:  Head: Normocephalic and atraumatic.  Right Ear: External ear normal.  Left Ear: External ear normal.  Nose: No mucosal edema or rhinorrhea.  Eyes: Pupils are equal, round, and reactive to light. Conjunctivae and EOM are normal.  Neck: No thyroid mass and no thyromegaly present.  Cardiovascular: Normal rate.   Pulmonary/Chest: Effort normal. No respiratory distress.  Neurological: She is alert and oriented to person, place, and time.  Skin: She is not diaphoretic.  Psychiatric: She has a normal mood and affect. Her behavior is normal.     Assessment  and Plan: Julia [name] is a 36 y.o. female who is here today for thyroid medication and allergies.   Hypothyroidism due to acquired atrophy of thyroid - Plan: TSH, levothyroxine (SYNTHROID, LEVOTHROID) 125 MCG tablet  Other allergic rhinitis - Plan: montelukast (SINGULAIR) 10 MG tablet  Trena PlattStephanie Jamason Peckham, PA-C [facility] 8/17/20189:30 AM

## 2017-05-16 NOTE — Patient Instructions (Addendum)
So good to see you here again.  I will follow up with your lab results on MyChart.     IF you received an x-ray today, you will receive an invoice [facility]. Please contact [facility] at [phone] with questions or concerns regarding your invoice.   IF you received labwork today, you will receive an invoice from [facility]. Please contact [facility] at [phone] with questions or concerns regarding your invoice.   Our billing staff will not be able to assist you with questions regarding bills from these companies.  You will be contacted with the lab results as soon as they are available. The fastest way to get your results is to activate your My Chart account. Instructions are located on the last page of this paperwork. If you have not heard from us regarding the results in 2 weeks, please contact this office.

## 2017-05-17 LAB — TSH: TSH: 3.64 u[IU]/mL (ref 0.450–4.500)

## 2017-11-10 ENCOUNTER — Encounter: Payer: Self-pay | Admitting: Physician Assistant

## 2017-11-10 ENCOUNTER — Ambulatory Visit: Payer: BLUE CROSS/BLUE SHIELD | Admitting: Physician Assistant

## 2017-11-10 VITALS — BP 139/85 | HR 85 | Temp 97.6°F | Resp 14 | Ht 63.0 in | Wt 187.0 lb

## 2017-11-10 DIAGNOSIS — E038 Other specified hypothyroidism: Secondary | ICD-10-CM | POA: Diagnosis not present

## 2017-11-10 DIAGNOSIS — E063 Autoimmune thyroiditis: Secondary | ICD-10-CM

## 2017-11-10 NOTE — Progress Notes (Signed)
PRIMARY CARE AT [facility] [address], [place] [address] [phone]  Date:  11/10/2017   Name:  AYESHA MARKWELL   DOB:  [birth year]   MRN:  [id number]  PCP:  Pearline Cables, MD    History of Present Illness:  KUULEI KLEIER is a 37 y.o. female patient who presents to PCP with  Chief Complaint  Patient presents with  . Medication Refill    Synthroid     Patient is here today for refill of her medications of the synthroid She has no adverse side effects to the medication  No thyroid symptoms of fatigue, diarrhea, constipation, change in mood, hair, or skin.  No cp, palpitations or sob   Patient Active Problem List   Diagnosis Date Noted  . Hypothyroidism due to Hashimoto's thyroiditis 02/25/2015  . GERD (gastroesophageal reflux disease) 02/25/2015  . Asthma 01/20/2013  . Mental handicap 07/05/2012  . HTN (hypertension) 11/14/2011    Past Medical History:  Diagnosis Date  . Allergy   . Asthma   . Hyperlipidemia   . Hypertension   . Ulcer     Past Surgical History:  Procedure Laterality Date  . APPENDECTOMY      Social History   Tobacco Use  . Smoking status: Never Smoker  . Smokeless tobacco: Never Used  Substance Use Topics  . Alcohol use: No  . Drug use: No    Family History  Problem Relation Age of Onset  . Hypertension Mother   . Hypertension Father   . Hypertension Brother   . Asthma Maternal Grandmother   . Hypertension Maternal Grandfather   . Arthritis Paternal Grandmother   . COPD Paternal Grandfather     Allergies  Allergen Reactions  . Azithromycin Anaphylaxis    z-pac  . Latex Shortness Of Breath  . Sulfa Antibiotics Shortness Of Breath and Nausea And Vomiting  . Lisinopril     Recurrent episodes of allergic reactions possibly some angioedema she should not be on this medication.    Medication list has been reviewed and updated.  Current Outpatient Medications on File Prior to Visit  Medication Sig Dispense Refill  .  albuterol (PROVENTIL HFA;VENTOLIN HFA) 108 (90 Base) MCG/ACT inhaler Inhale 2 puffs into the lungs every 4 (four) hours as needed for wheezing or shortness of breath (cough, shortness of breath or wheezing.). 1 Inhaler 1  . budesonide (RHINOCORT ALLERGY) 32 MCG/ACT nasal spray Place into both nostrils daily.    Marland Kitchen EPINEPHrine 0.3 mg/0.3 mL IJ SOAJ injection Inject 0.3 mLs (0.3 mg total) into the muscle once. 1 Device 5  . ibuprofen (ADVIL,MOTRIN) 200 MG tablet Take 600 mg by mouth every 6 (six) hours as needed. Reported on 09/18/2015    . levothyroxine (SYNTHROID, LEVOTHROID) 125 MCG tablet Take 1 tablet (125 mcg total) by mouth daily. 90 tablet 1  . montelukast (SINGULAIR) 10 MG tablet Take 1 tablet (10 mg total) by mouth at bedtime. 90 tablet 3  . omeprazole (PRILOSEC) 20 MG capsule Take 20 mg by mouth as needed.     No current facility-administered medications on file prior to visit.     ROS ROS otherwise unremarkable unless listed above.  Physical Examination: BP 139/85   Pulse 85   Temp 97.6 F (36.4 C) (Oral)   Resp 14   Ht 5\' 3"  (1.6 m)   Wt 187 lb (84.8 kg)   SpO2 96%   BMI 33.13 kg/m  Ideal Body Weight: Weight in (lb) to have  BMI = 25: 140.8  Physical Exam  Constitutional: She is oriented to person, place, and time. She appears well-developed and well-nourished. No distress.  HENT:  Head: Normocephalic and atraumatic.  Right Ear: External ear normal.  Left Ear: External ear normal.  Eyes: Conjunctivae and EOM are normal. Pupils are equal, round, and reactive to light.  Cardiovascular: Normal rate, regular rhythm and intact distal pulses. Exam reveals no gallop and no friction rub.  No murmur heard. Pulmonary/Chest: Effort normal and breath sounds normal. No respiratory distress. She has no wheezes.  Neurological: She is alert and oriented to person, place, and time.  Skin: She is not diaphoretic.  Psychiatric: She has a normal mood and affect. Her behavior is normal.      Assessment and Plan: Elder LoveChristine R Quesnell is a 37 y.o. female who is here today for cc of  Chief Complaint  Patient presents with  . Medication Refill    Synthroid  advised to follow up within 3 months for physical exam Hypothyroidism due to Hashimoto's thyroiditis - Plan: TSH  Trena PlattStephanie Hara Milholland, PA-C [facility] 2/26/20197:44 AM

## 2017-11-10 NOTE — Patient Instructions (Addendum)
It was great seeing you today!!! I will follow up shortly with your blood work. Let's plan to follow up in 6 months for a physical exam.     IF you received an x-ray today, you will receive an invoice from [facility]. Please contact [facility] at [phone] with questions or concerns regarding your invoice.   IF you received labwork today, you will receive an invoice from [facility]. Please contact [facility] at [phone] with questions or concerns regarding your invoice.   Our billing staff will not be able to assist you with questions regarding bills from these companies.  You will be contacted with the lab results as soon as they are available. The fastest way to get your results is to activate your My Chart account. Instructions are located on the last page of this paperwork. If you have not heard from us regarding the results in 2 weeks, please contact this office.

## 2017-11-11 LAB — TSH: TSH: 0.382 u[IU]/mL — ABNORMAL LOW (ref 0.450–4.500)

## 2017-11-14 ENCOUNTER — Ambulatory Visit: Payer: BLUE CROSS/BLUE SHIELD | Admitting: Physician Assistant

## 2017-11-22 ENCOUNTER — Other Ambulatory Visit: Payer: Self-pay | Admitting: Physician Assistant

## 2017-11-22 DIAGNOSIS — E034 Atrophy of thyroid (acquired): Secondary | ICD-10-CM

## 2017-11-22 MED ORDER — LEVOTHYROXINE SODIUM 125 MCG PO TABS
125.0000 ug | ORAL_TABLET | Freq: Every day | ORAL | 1 refills | Status: DC
Start: 2017-11-22 — End: 2018-03-03

## 2017-11-22 NOTE — Telephone Encounter (Signed)
Copied from CRM [id number]. Topic: Quick Communication - Rx Refill/Question >> Nov 22, 2017 11:29 AM Eston Mouldavis, Viona Hosking B wrote: Medication: levothyroxine (SYNTHROID, LEVOTHROID) 125 MCG tablet   Has the patient contacted their pharmacy? yes   (Agent: If no, request that the patient contact the pharmacy for the refill.)   Preferred Pharmacy (with phone number or street name): [facility] [place] - [address] [address] [phone] (Phone) [phone] (Fax)    Agent: Please be advised that RX refills may take up to 3 business days. We ask that you follow-up with your pharmacy.

## 2017-11-29 ENCOUNTER — Encounter: Payer: Self-pay | Admitting: Physician Assistant

## 2018-01-11 ENCOUNTER — Encounter: Payer: Self-pay | Admitting: Physician Assistant

## 2018-02-23 ENCOUNTER — Telehealth: Payer: Self-pay | Admitting: Physician Assistant

## 2018-02-23 NOTE — Telephone Encounter (Signed)
Copied from CRM [id number]. Topic: Quick Communication - Rx Refill/Question >> Feb 23, 2018  8:52 AM Leafy Ro wrote: Medication: levothyroxine 125 mg #30  Has the patient contacted their pharmacy? No. Pt pcp stephanie english is no longer at [facility]. Pt will be seeing chelle jeffrey on 03-03-18 (Agent: If no, request that the patient contact the pharmacy for the refill.) no (Preferred Pharmacy  [facility] Agent: Please be advised that RX refills may take up to 3 business days. We ask that you follow-up with your pharmacy.

## 2018-02-24 NOTE — Telephone Encounter (Signed)
Pt picked up refill from pharmacy.

## 2018-03-03 ENCOUNTER — Encounter: Payer: Self-pay | Admitting: Physician Assistant

## 2018-03-03 ENCOUNTER — Other Ambulatory Visit: Payer: Self-pay

## 2018-03-03 ENCOUNTER — Ambulatory Visit: Payer: BLUE CROSS/BLUE SHIELD | Admitting: Physician Assistant

## 2018-03-03 VITALS — BP 122/78 | HR 75 | Temp 97.8°F | Resp 16 | Ht 63.47 in | Wt 186.6 lb

## 2018-03-03 DIAGNOSIS — E038 Other specified hypothyroidism: Secondary | ICD-10-CM

## 2018-03-03 DIAGNOSIS — Z114 Encounter for screening for human immunodeficiency virus [HIV]: Secondary | ICD-10-CM

## 2018-03-03 DIAGNOSIS — J3089 Other allergic rhinitis: Secondary | ICD-10-CM | POA: Diagnosis not present

## 2018-03-03 DIAGNOSIS — E063 Autoimmune thyroiditis: Secondary | ICD-10-CM

## 2018-03-03 MED ORDER — MONTELUKAST SODIUM 10 MG PO TABS: 10.0000 mg | ORAL_TABLET | Freq: Every day | ORAL | 3 refills | Status: DC

## 2018-03-03 MED ORDER — LEVOTHYROXINE SODIUM 125 MCG PO TABS: 125.0000 ug | ORAL_TABLET | Freq: Every day | ORAL | 1 refills | Status: DC

## 2018-03-03 NOTE — Patient Instructions (Signed)
     IF you received an x-ray today, you will receive an invoice from [facility]. Please contact [facility] at [phone] with questions or concerns regarding your invoice.   IF you received labwork today, you will receive an invoice from [facility]. Please contact [facility] at [phone] with questions or concerns regarding your invoice.   Our billing staff will not be able to assist you with questions regarding bills from these companies.  You will be contacted with the lab results as soon as they are available. The fastest way to get your results is to activate your My Chart account. Instructions are located on the last page of this paperwork. If you have not heard from us regarding the results in 2 weeks, please contact this office.     

## 2018-03-03 NOTE — Progress Notes (Signed)
Subjective:    Patient ID: Julia [name], female    DOB: 12-09-1980, 37 y.o.   MRN: [id number]  Patient is here to refill her synthroid medication and follow up on TSH labs from February. She has a history of unspecified mental handicap and is here with her mother. They were called in February to return to the office to evaluate low TSH results, but did not come earlier due to flu season and trying to make minimal trips to office. She is currently experiencing no adverse effects of medication. Of note, she describes intermittent extreme sensitivity to a variety of certain scents that causes her to become excessively hot and fatigued for about 2 weeks. She has seen an allergist at the [facility] who prescribed her with Xyzal and told her to avoid the triggering scents. The Xyzal did not help, and they are curious as to whether there are any other remedies to alleviate the symptoms.      Review of Systems  Constitutional: Positive for diaphoresis. Negative for chills, fever and unexpected weight change.  HENT: Negative for congestion, rhinorrhea and sore throat.   Respiratory: Negative for cough and chest tightness.   Cardiovascular: Negative for chest pain and palpitations.  Gastrointestinal: Positive for constipation (intermittent). Negative for abdominal pain, diarrhea, nausea and vomiting.  Endocrine: Negative for cold intolerance and heat intolerance.  Genitourinary: Negative for dysuria.  Neurological: Negative for dizziness, weakness, numbness and headaches.  Psychiatric/Behavioral: Negative for agitation and confusion.    Patient Active Problem List   Diagnosis Date Noted  . Hypothyroidism due to Hashimoto's thyroiditis 02/25/2015  . GERD (gastroesophageal reflux disease) 02/25/2015  . Asthma 01/20/2013  . Mental handicap 07/05/2012  . HTN (hypertension) 11/14/2011   Prior to Admission medications   Medication Sig Start Date End Date Taking? Authorizing Provider    albuterol (PROVENTIL HFA;VENTOLIN HFA) 108 (90 Base) MCG/ACT inhaler Inhale 2 puffs into the lungs every 4 (four) hours as needed for wheezing or shortness of breath (cough, shortness of breath or wheezing.). 09/06/16  Yes Bing Neighbors, FNP  budesonide (RHINOCORT ALLERGY) 32 MCG/ACT nasal spray Place into both nostrils daily.   Yes [provider]  EPINEPHrine 0.3 mg/0.3 mL IJ SOAJ injection Inject 0.3 mLs (0.3 mg total) into the muscle once. 02/27/16  Yes Collene Gobble, MD  ibuprofen (ADVIL,MOTRIN) 200 MG tablet Take 600 mg by mouth every 6 (six) hours as needed. Reported on 09/18/2015   Yes [provider]  levothyroxine (SYNTHROID, LEVOTHROID) 125 MCG tablet Take 1 tablet (125 mcg total) by mouth daily. 11/22/17  Yes English, Judeth Cornfield D, PA  montelukast (SINGULAIR) 10 MG tablet Take 1 tablet (10 mg total) by mouth at bedtime. 05/16/17  Yes English, Judeth Cornfield D, PA  omeprazole (PRILOSEC) 20 MG capsule Take 20 mg by mouth as needed.   Yes [provider]   Allergies  Allergen Reactions  . Azithromycin Anaphylaxis    z-pac  . Latex Shortness Of Breath  . Sulfa Antibiotics Shortness Of Breath and Nausea And Vomiting  . Lisinopril     Recurrent episodes of allergic reactions possibly some angioedema she should not be on this medication.       Objective:   Physical Exam  Constitutional: She appears well-nourished. No distress.  HENT:  Head: Normocephalic and atraumatic.  Eyes: Pupils are equal, round, and reactive to light.  Neck: No thyromegaly present.  Cardiovascular: Normal rate, regular rhythm and normal heart sounds. Exam reveals no gallop and no  friction rub.  No murmur heard. Pulmonary/Chest: Effort normal and breath sounds normal. No stridor. She has no wheezes. She has no rales.  Lymphadenopathy:    She has no cervical adenopathy.  Skin: Skin is warm and dry.          Assessment & Plan:  1. Hypothyroidism due to Hashimoto's  thyroiditis Appears to be well controlled on medication; however, repeat labs and reevaluate at follow up appointment due to abnormal TSH in February.  - TSH - T4, free - T3, free   2. Screening for HIV (human immunodeficiency virus) -Routine screening: - HIV antibody  3. Other allergic rhinitis Currently well controlled, continue regimen. - montelukast (SINGULAIR) 10 MG tablet; Take 1 tablet (10 mg total) by mouth at bedtime.  Dispense: 90 tablet; Refill: 3  4. Hypothyroidism due to acquired atrophy of thyroid Appears to be well controlled on medication; however, repeat labs and reevaluate at follow up appointment due to abnormal TSH in February. Continue current dosage in the meantime: - levothyroxine (SYNTHROID, LEVOTHROID) 125 MCG tablet; Take 1 tablet (125 mcg total) by mouth daily.  Dispense: 90 tablet; Refill: 1  Return in about 6 months (around 09/02/2018) for Annual Exam and re-evaluation of thyroid, with Dr. Creta Levin.

## 2018-03-03 NOTE — Progress Notes (Signed)
Patient ID: Julia [name], female    DOB: [birth year], 37 y.o.   MRN: [id number]  PCP: Pearline Cables, MD, though she hasn't seen this patient since 05/2013. Most recently, this patient has been seeing PA [place], who recently left this practice.  Chief Complaint  Patient presents with  . Hypothyroidism    follow up     Subjective:   Presents for evaluation of Hashimoto's thyroiditis. She is accompanied by her mother who assists with the history.  At her visit in February, the TSH was slightly suppressed. She was advised to RTC in 2 weeks for re-evaluation.She did not return at the recommended time, preferring not to come in during flu season.  Notes a significant heat intolerance, lasting several weeks, triggered by smells (perfumes, for example). No benefit from Xyzal. Did not follow-up with allergy specialist. Significant enough that they do not venture from home often. Watch church on TV. Reports excessive sweating sometimes. Intermittent constipation.  Review of Systems Constitutional: Positive for diaphoresis. Negative for chills, fever and unexpected weight change.  HENT: Negative for congestion, rhinorrhea and sore throat.   Respiratory: Negative for cough and chest tightness.   Cardiovascular: Negative for chest pain and palpitations.  Gastrointestinal: Positive for constipation (intermittent). Negative for abdominal pain, diarrhea, nausea and vomiting.  Endocrine: Negative for cold intolerance and heat intolerance.  Genitourinary: Negative for dysuria.  Neurological: Negative for dizziness, weakness, numbness and headaches.  Psychiatric/Behavioral: Negative for agitation and confusion.       Patient Active Problem List   Diagnosis Date Noted  . Hypothyroidism due to Hashimoto's thyroiditis 02/25/2015  . GERD (gastroesophageal reflux disease) 02/25/2015  . Asthma 01/20/2013  . Mental handicap 07/05/2012  . HTN (hypertension) 11/14/2011     Prior to  Admission medications   Medication Sig Start Date End Date Taking? Authorizing Provider  albuterol (PROVENTIL HFA;VENTOLIN HFA) 108 (90 Base) MCG/ACT inhaler Inhale 2 puffs into the lungs every 4 (four) hours as needed for wheezing or shortness of breath (cough, shortness of breath or wheezing.). 09/06/16  Yes Bing Neighbors, FNP  budesonide (RHINOCORT ALLERGY) 32 MCG/ACT nasal spray Place into both nostrils daily.   Yes [provider]  EPINEPHrine 0.3 mg/0.3 mL IJ SOAJ injection Inject 0.3 mLs (0.3 mg total) into the muscle once. 02/27/16  Yes Collene Gobble, MD  ibuprofen (ADVIL,MOTRIN) 200 MG tablet Take 600 mg by mouth every 6 (six) hours as needed. Reported on 09/18/2015   Yes [provider]  levothyroxine (SYNTHROID, LEVOTHROID) 125 MCG tablet Take 1 tablet (125 mcg total) by mouth daily. 11/22/17  Yes English, Judeth Cornfield D, PA  montelukast (SINGULAIR) 10 MG tablet Take 1 tablet (10 mg total) by mouth at bedtime. 05/16/17  Yes English, Judeth Cornfield D, PA  omeprazole (PRILOSEC) 20 MG capsule Take 20 mg by mouth as needed.   Yes [provider]     Allergies  Allergen Reactions  . Azithromycin Anaphylaxis    z-pac  . Latex Shortness Of Breath  . Sulfa Antibiotics Shortness Of Breath and Nausea And Vomiting  . Lisinopril     Recurrent episodes of allergic reactions possibly some angioedema she should not be on this medication.       Objective:  Physical Exam  Constitutional: She is oriented to person, place, and time. She appears well-developed and well-nourished. She is active and cooperative. No distress.  BP 122/78   Pulse 75   Temp 97.8 F (36.6 C)   Resp 16  Ht 5' 3.47" (1.612 m)   Wt 186 lb 9.6 oz (84.6 kg)   SpO2 97%   BMI 32.57 kg/m   HENT:  Head: Normocephalic and atraumatic.  Right Ear: Hearing normal.  Left Ear: Hearing normal.  Eyes: Conjunctivae are normal. No scleral icterus.  Neck: Normal range of motion. Neck supple. No thyromegaly  present.  Cardiovascular: Normal rate, regular rhythm and normal heart sounds.  Pulses:      Radial pulses are 2+ on the right side, and 2+ on the left side.  Pulmonary/Chest: Effort normal and breath sounds normal.  Lymphadenopathy:       Head (right side): No tonsillar, no preauricular, no posterior auricular and no occipital adenopathy present.       Head (left side): No tonsillar, no preauricular, no posterior auricular and no occipital adenopathy present.    She has no cervical adenopathy.       Right: No supraclavicular adenopathy present.       Left: No supraclavicular adenopathy present.  Neurological: She is alert and oriented to person, place, and time. No sensory deficit.  Skin: Skin is warm, dry and intact. No rash noted. No cyanosis or erythema. Nails show no clubbing.  Psychiatric: She has a normal mood and affect. Her speech is normal and behavior is normal.           Assessment & Plan:   Problem List Items Addressed This Visit    Hypothyroidism due to Hashimoto's thyroiditis - Primary    Update labs.  Adjust levothyroxine dose as indicated.      Relevant Medications   levothyroxine (SYNTHROID, LEVOTHROID) 125 MCG tablet   Other Relevant Orders   TSH (Completed)   T4, free (Completed)   T3, free (Completed)    Other Visit Diagnoses    Screening for HIV (human immunodeficiency virus)       Relevant Orders   HIV antibody (Completed)   Other allergic rhinitis       Certainly she has environmental triggers.  If she would like to pursue additional evaluation she will advise [place].   Relevant Medications   montelukast (SINGULAIR) 10 MG tablet       Return in about 6 months (around 09/02/2018) for Annual Exam and re-evaluation of thyroid, with Dr. Creta Levin.   Fernande Bras, PA-C [facility]

## 2018-03-04 LAB — T4, FREE: Free T4: 1.44 ng/dL (ref 0.82–1.77)

## 2018-03-04 LAB — TSH: TSH: 1.03 u[IU]/mL (ref 0.450–4.500)

## 2018-03-04 LAB — T3, FREE: T3, Free: 2.1 pg/mL (ref 2.0–4.4)

## 2018-03-04 LAB — HIV ANTIBODY (ROUTINE TESTING W REFLEX): HIV Screen 4th Generation wRfx: NONREACTIVE

## 2018-03-06 NOTE — Assessment & Plan Note (Signed)
Update labs.  Adjust levothyroxine dose as indicated.

## 2018-09-14 ENCOUNTER — Encounter: Payer: Self-pay | Admitting: Family Medicine

## 2018-09-14 ENCOUNTER — Ambulatory Visit: Payer: BLUE CROSS/BLUE SHIELD | Admitting: Family Medicine

## 2018-09-14 ENCOUNTER — Other Ambulatory Visit: Payer: Self-pay

## 2018-09-14 VITALS — BP 132/87 | HR 80 | Temp 98.5°F | Resp 16 | Ht 62.99 in | Wt 193.0 lb

## 2018-09-14 DIAGNOSIS — J3089 Other allergic rhinitis: Secondary | ICD-10-CM

## 2018-09-14 DIAGNOSIS — E038 Other specified hypothyroidism: Secondary | ICD-10-CM | POA: Diagnosis not present

## 2018-09-14 DIAGNOSIS — E063 Autoimmune thyroiditis: Secondary | ICD-10-CM | POA: Diagnosis not present

## 2018-09-14 MED ORDER — MONTELUKAST SODIUM 10 MG PO TABS: 10.0000 mg | ORAL_TABLET | Freq: Every day | ORAL | 3 refills | Status: DC

## 2018-09-14 MED ORDER — LEVOTHYROXINE SODIUM 125 MCG PO TABS: 125.0000 ug | ORAL_TABLET | Freq: Every day | ORAL | 1 refills | Status: DC

## 2018-09-14 NOTE — Progress Notes (Signed)
Subjective:    Patient: Julia [name]  DOB: 06-18-1981; 37 y.o.   MRN: [id number]  Chief Complaint  Patient presents with  . Hypothyroidism  . Medication Refill    HPI Just needs labs drawn for levothyroxine and singulair refills - doing well. No complains  Medical History Past Medical History:  Diagnosis Date  . Allergy   . Asthma   . Hyperlipidemia   . Hypertension   . Ulcer    Past Surgical History:  Procedure Laterality Date  . APPENDECTOMY     Current Outpatient Medications on File Prior to Visit  Medication Sig Dispense Refill  . albuterol (PROVENTIL HFA;VENTOLIN HFA) 108 (90 Base) MCG/ACT inhaler Inhale 2 puffs into the lungs every 4 (four) hours as needed for wheezing or shortness of breath (cough, shortness of breath or wheezing.). 1 Inhaler 1  . budesonide (RHINOCORT ALLERGY) 32 MCG/ACT nasal spray Place into both nostrils daily.    Marland Kitchen EPINEPHrine 0.3 mg/0.3 mL IJ SOAJ injection Inject 0.3 mLs (0.3 mg total) into the muscle once. 1 Device 5  . ibuprofen (ADVIL,MOTRIN) 200 MG tablet Take 600 mg by mouth every 6 (six) hours as needed. Reported on 09/18/2015    . levothyroxine (SYNTHROID, LEVOTHROID) 125 MCG tablet Take 1 tablet (125 mcg total) by mouth daily. 90 tablet 1  . montelukast (SINGULAIR) 10 MG tablet Take 1 tablet (10 mg total) by mouth at bedtime. 90 tablet 3  . omeprazole (PRILOSEC) 20 MG capsule Take 20 mg by mouth as needed.     No current facility-administered medications on file prior to visit.    Allergies  Allergen Reactions  . Azithromycin Anaphylaxis    z-pac  . Latex Shortness Of Breath  . Sulfa Antibiotics Shortness Of Breath and Nausea And Vomiting  . Lisinopril     Recurrent episodes of allergic reactions possibly some angioedema she should not be on this medication.   Family History  Problem Relation Age of Onset  . Hypertension Mother   . Hypertension Father   . Hypertension Brother   . Asthma Maternal Grandmother   .  Hypertension Maternal Grandfather   . Arthritis Paternal Grandmother   . COPD Paternal Grandfather    Social History   Socioeconomic History  . Marital status: Single    Spouse name: Not on file  . Number of children: Not on file  . Years of education: Not on file  . Highest education level: Not on file  Occupational History  . Not on file  Social Needs  . Financial resource strain: Not on file  . Food insecurity:    Worry: Not on file    Inability: Not on file  . Transportation needs:    Medical: Not on file    Non-medical: Not on file  Tobacco Use  . Smoking status: Never Smoker  . Smokeless tobacco: Never Used  Substance and Sexual Activity  . Alcohol use: No  . Drug use: No  . Sexual activity: Never    Birth control/protection: Abstinence  Lifestyle  . Physical activity:    Days per week: Not on file    Minutes per session: Not on file  . Stress: Not on file  Relationships  . Social connections:    Talks on phone: Not on file    Gets together: Not on file    Attends religious service: Not on file    Active member of club or organization: Not on file    Attends meetings of clubs  or organizations: Not on file    Relationship status: Not on file  Other Topics Concern  . Not on file  Social History Narrative  . Not on file   Depression screen PHQ 2/9 03/03/2018 11/10/2017 8/13/[facility]2018 09/06/2016 06/10/2016  Decreased Interest 0 0 0 0 0  Down, Depressed, Hopeless 0 0 0 0 0  PHQ - 2 Score 0 0 0 0 0    ROS As noted in HPI  Objective:  BP 132/87   Pulse 80   Temp 98.5 F (36.9 C) (Oral)   Resp 16   Ht 5' 2.99" (1.6 m)   Wt 193 lb (87.5 kg)   LMP 08/22/2018 (Approximate)   SpO2 100%   BMI 34.20 kg/m  Physical Exam Constitutional:      General: She is not in acute distress.    Appearance: She is well-developed. She is not diaphoretic.  HENT:     Head: Normocephalic and atraumatic.     Right Ear: External ear normal.  Eyes:     General: No scleral  icterus.    Conjunctiva/sclera: Conjunctivae normal.  Pulmonary:     Effort: Pulmonary effort is normal.  Skin:    General: Skin is warm and dry.     Findings: No erythema.  Neurological:     Mental Status: She is alert and oriented to person, place, and time.  Psychiatric:        Behavior: Behavior normal.     POC TESTING Office Visit on 09/14/2018  Component Date Value Ref Range Status  . TSH 09/14/2018 0.626  0.450 - 4.500 uIU/mL Final  . T3, Free 09/14/2018 2.3  2.0 - 4.4 pg/mL Final  . Free T4 09/14/2018 1.53  0.82 - 1.77 ng/dL Final     Assessment & Plan:   1. Hypothyroidism due to Hashimoto's thyroiditis   2. Other allergic rhinitis    Lab Results  Component Value Date   TSH 0.626 09/14/2018   TSH 1.030 03/03/2018   TSH 0.382 (L) 11/10/2017     Patient will continue on current chronic medications other than changes noted above, so ok to refill when needed.   See after visit summary for patient specific instructions.  Orders Placed This Encounter  Procedures  . TSH+T4F+T3Free    Meds ordered this encounter  Medications  . DISCONTD: levothyroxine (SYNTHROID, LEVOTHROID) 125 MCG tablet    Sig: Take 1 tablet (125 mcg total) by mouth daily.    Dispense:  90 tablet    Refill:  1  . montelukast (SINGULAIR) 10 MG tablet    Sig: Take 1 tablet (10 mg total) by mouth at bedtime.    Dispense:  90 tablet    Refill:  3  . levothyroxine (SYNTHROID, LEVOTHROID) 125 MCG tablet    Sig: Take 1 tablet (125 mcg total) by mouth daily.    Dispense:  90 tablet    Refill:  3    Pt does not need currently. Please add this on as refills to current rx.    Patient verbalized to me that they understand the following: diagnosis, what is being done for them, what to expect and what should be done at home.  Their questions have been answered. They understand that I am unable to predict every possible medication interaction or adverse outcome and that if any unexpected symptoms  arise, they should contact us and their pharmacist, as well as never hesitate to seek urgent/emergent care at [facility] or ER if they think  it might be warranted.    Norberto Sorenson, MD, MPH Primary Care at [facility] [address] [place], [place]  [address] [phone] Office phone  [phone] Office fax  09/14/18 11:57 AM

## 2018-09-14 NOTE — Patient Instructions (Addendum)
   If you have lab work done today you will be contacted with your lab results within the next 2 weeks.  If you have not heard from us then please contact us. The fastest way to get your results is to register for My Chart.   IF you received an x-ray today, you will receive an invoice from [facility]. Please contact  Radiology at [phone] with questions or concerns regarding your invoice.   IF you received labwork today, you will receive an invoice from [facility]. Please contact [facility] at [phone] with questions or concerns regarding your invoice.   Our billing staff will not be able to assist you with questions regarding bills from these companies.  You will be contacted with the lab results as soon as they are available. The fastest way to get your results is to activate your My Chart account. Instructions are located on the last page of this paperwork. If you have not heard from us regarding the results in 2 weeks, please contact this office.     Hypothyroidism Hypothyroidism is a disorder of the thyroid. The thyroid is a large gland that is located in the lower front of the neck. The thyroid releases hormones that control how the body works. With hypothyroidism, the thyroid does not make enough of these hormones. What are the causes? Causes of hypothyroidism may include:  Viral infections.  Pregnancy.  Your own defense system (immune system) attacking your thyroid.  Certain medicines.  Birth defects.  Past radiation treatments to your head or neck.  Past treatment with radioactive iodine.  Past surgical removal of part or all of your thyroid.  Problems with the gland that is located in the center of your brain (pituitary).  What are the signs or symptoms? Signs and symptoms of hypothyroidism may include:  Feeling as though you have no energy (lethargy).  Inability to tolerate cold.  Weight gain that is not explained by a change in diet  or exercise habits.  Dry skin.  Coarse hair.  Menstrual irregularity.  Slowing of thought processes.  Constipation.  Sadness or depression.  How is this diagnosed? Your health care provider may diagnose hypothyroidism with blood tests and ultrasound tests. How is this treated? Hypothyroidism is treated with medicine that replaces the hormones that your body does not make. After you begin treatment, it may take several weeks for symptoms to go away. Follow these instructions at home:  Take medicines only as directed by your health care provider.  If you start taking any new medicines, tell your health care provider.  Keep all follow-up visits as directed by your health care provider. This is important. As your condition improves, your dosage needs may change. You will need to have blood tests regularly so that your health care provider can watch your condition. Contact a health care provider if:  Your symptoms do not get better with treatment.  You are taking thyroid replacement medicine and: ? You sweat excessively. ? You have tremors. ? You feel anxious. ? You lose weight rapidly. ? You cannot tolerate heat. ? You have emotional swings. ? You have diarrhea. ? You feel weak. Get help right away if:  You develop chest pain.  You develop an irregular heartbeat.  You develop a rapid heartbeat. This information is not intended to replace advice given to you by your health care provider. Make sure you discuss any questions you have with your health care provider. Document Released: 09/20/2005 Document Revised: 02/26/2016 Document   Reviewed: 02/05/2014 Elsevier Interactive Patient Education  2018 Elsevier Inc.  

## 2018-09-15 LAB — TSH+T4F+T3FREE
FREE T4: 1.53 ng/dL (ref 0.82–1.77)
T3 FREE: 2.3 pg/mL (ref 2.0–4.4)
TSH: 0.626 u[IU]/mL (ref 0.450–4.500)

## 2018-10-03 MED ORDER — LEVOTHYROXINE SODIUM 125 MCG PO TABS: 125.0000 ug | ORAL_TABLET | Freq: Every day | ORAL | 3 refills | Status: DC

## 2019-03-12 ENCOUNTER — Telehealth: Payer: Self-pay | Admitting: Family Medicine

## 2019-03-12 NOTE — Telephone Encounter (Signed)
Tried contacting pt at [phone] no answer and no voicemail setup.  Will try again later. Dgaddy, CMA

## 2019-03-12 NOTE — Telephone Encounter (Signed)
Copied from [place] [id number]. Topic: General - Other >> Mar 12, 2019 12:00 PM Carolyn Stare wrote: Pt has not had a visit since Dec 2019, please call to schedule pt is requesting a refill    levothyroxine (SYNTHROID, LEVOTHROID) 125 MCG tablet >> Mar 12, 2019  8:78 PM Rafaelle, Solmon Ice wrote: Tired calling pt per [facility] not a working number [facility]

## 2019-03-19 ENCOUNTER — Telehealth (INDEPENDENT_AMBULATORY_CARE_PROVIDER_SITE_OTHER): Payer: BC Managed Care – PPO | Admitting: Family Medicine

## 2019-03-19 ENCOUNTER — Other Ambulatory Visit: Payer: Self-pay

## 2019-03-19 DIAGNOSIS — E063 Autoimmune thyroiditis: Secondary | ICD-10-CM | POA: Diagnosis not present

## 2019-03-19 DIAGNOSIS — E038 Other specified hypothyroidism: Secondary | ICD-10-CM | POA: Diagnosis not present

## 2019-03-19 DIAGNOSIS — J4520 Mild intermittent asthma, uncomplicated: Secondary | ICD-10-CM

## 2019-03-19 DIAGNOSIS — J3089 Other allergic rhinitis: Secondary | ICD-10-CM

## 2019-03-19 DIAGNOSIS — K219 Gastro-esophageal reflux disease without esophagitis: Secondary | ICD-10-CM

## 2019-03-19 MED ORDER — MONTELUKAST SODIUM 10 MG PO TABS: 10.0000 mg | ORAL_TABLET | Freq: Every day | ORAL | 3 refills | Status: DC

## 2019-03-19 MED ORDER — LEVOTHYROXINE SODIUM 125 MCG PO TABS: 125.0000 ug | ORAL_TABLET | Freq: Every day | ORAL | 3 refills | Status: DC

## 2019-03-19 NOTE — Addendum Note (Signed)
Addended by: Donnelly Stager on: 03/19/2019 02:08 PM   Modules accepted: Orders

## 2019-03-19 NOTE — Progress Notes (Signed)
Virtual Visit Note  I connected with patient on 03/19/19 at 1145am by phone and verified that I am speaking with the correct person using two identifiers. Julia [name] is currently located at home and patient and her mother is currently with them during visit. The provider, Myles LippsIrma M Santiago, MD is located in their office at time of visit.  I discussed the limitations, risks, security and privacy concerns of performing an evaluation and management service by telephone and the availability of in person appointments. I also discussed with the patient that there may be a patient responsible charge related to this service. The patient expressed understanding and agreed to proceed.   CC: TOC  HPI   Previous PCP Dr Clelia CroftShaw - last OV Dec 2019 ? 1. Hypothyroidism -  Takes 125mcg of levothyroxine, reports stable on this dose for about 2 years Denies any missed doses Denies any fatigue, changes in mood, temperature tolerance, hair or nails changes, palpitations, occasional constipation Lab Results  Component Value Date   TSH 0.626 09/14/2018    2. GERD -  H/o ulcers - denies any transfusions or surgeries Takes omeprazole prn now Denies any abd pain, nausea, vomiting, black tarry stools  3. Asthma -  Takes Singulair daily Takes albuterol as needed, less then twice a week Denies any cough, SOB Endorses occ wheezing - albuterol resolves  Denies any fever or chills   Allergies  Allergen Reactions  . Azithromycin Anaphylaxis    z-pac  . Latex Shortness Of Breath  . Sulfa Antibiotics Shortness Of Breath and Nausea And Vomiting  . Lisinopril     Recurrent episodes of allergic reactions possibly some angioedema she should not be on this medication.    Prior to Admission medications   Medication Sig Start Date End Date Taking? Authorizing Provider  albuterol (PROVENTIL HFA;VENTOLIN HFA) 108 (90 Base) MCG/ACT inhaler Inhale 2 puffs into the lungs every 4 (four) hours as needed for  wheezing or shortness of breath (cough, shortness of breath or wheezing.). 09/06/16   Bing NeighborsHarris, Kimberly S, FNP  EPINEPHrine 0.3 mg/0.3 mL IJ SOAJ injection Inject 0.3 mLs (0.3 mg total) into the muscle once. 02/27/16   Collene Gobbleaub, Steven A, MD  ibuprofen (ADVIL,MOTRIN) 200 MG tablet Take 600 mg by mouth every 6 (six) hours as needed. Reported on 09/18/2015    [provider]  levothyroxine (SYNTHROID, LEVOTHROID) 125 MCG tablet Take 1 tablet (125 mcg total) by mouth daily. 10/03/18   Sherren MochaShaw, Eva N, MD  montelukast (SINGULAIR) 10 MG tablet Take 1 tablet (10 mg total) by mouth at bedtime. 09/14/18   Sherren MochaShaw, Eva N, MD  omeprazole (PRILOSEC) 20 MG capsule Take 20 mg by mouth as needed.    [provider]    Past Medical History:  Diagnosis Date  . Allergy   . Asthma   . Hyperlipidemia   . Hypertension   . Ulcer     Past Surgical History:  Procedure Laterality Date  . APPENDECTOMY      Social History   Tobacco Use  . Smoking status: Never Smoker  . Smokeless tobacco: Never Used  Substance Use Topics  . Alcohol use: No    Family History  Problem Relation Age of Onset  . Hypertension Mother   . Hypertension Father   . Hypertension Brother   . Asthma Maternal Grandmother   . Hypertension Maternal Grandfather   . Arthritis Paternal Grandmother   . COPD Paternal Grandfather     ROS Per hpi  Objective  Vitals as reported by the patient: none  BP Readings from Last 3 Encounters:  09/14/18 132/87  03/03/18 122/78  11/10/17 139/85    ASSESSMENT and PLAN  1. Hypothyroidism due to Hashimoto's thyroiditis Checking labs today, medications will be adjusted as needed.  - levothyroxine (SYNTHROID) 125 MCG tablet; Take 1 tablet (125 mcg total) by mouth daily. - TSH; Future  2. Mild intermittent asthma, unspecified whether complicated Controlled. Continue current regime.   3. Gastroesophageal reflux disease without esophagitis Controlled. Continue current regime.   4.  Other allergic rhinitis - montelukast (SINGULAIR) 10 MG tablet; Take 1 tablet (10 mg total) by mouth at bedtime.   FOLLOW-UP: 6 months   The above assessment and management plan was discussed with the patient. The patient verbalized understanding of and has agreed to the management plan. Patient is aware to call the clinic if symptoms persist or worsen. Patient is aware when to return to the clinic for a follow-up visit. Patient educated on when it is appropriate to go to the emergency department.    I provided 16 minutes of non-face-to-face time during this encounter.  Rutherford Guys, MD Primary Care at [place], [place] [address] Ph.  [phone] Fax [phone]

## 2019-03-19 NOTE — Progress Notes (Signed)
TOC, needs refills on the synthroid only. Not having any other medical concerns at this time. No travel, anxiety or depression

## 2019-03-21 LAB — TSH: TSH: 1.02 u[IU]/mL (ref 0.450–4.500)

## 2019-08-06 ENCOUNTER — Other Ambulatory Visit: Payer: Self-pay | Admitting: Family Medicine

## 2019-08-06 DIAGNOSIS — E038 Other specified hypothyroidism: Secondary | ICD-10-CM

## 2019-08-13 ENCOUNTER — Other Ambulatory Visit: Payer: Self-pay

## 2019-08-13 ENCOUNTER — Telehealth (INDEPENDENT_AMBULATORY_CARE_PROVIDER_SITE_OTHER): Payer: BC Managed Care – PPO | Admitting: Family Medicine

## 2019-08-13 DIAGNOSIS — E038 Other specified hypothyroidism: Secondary | ICD-10-CM | POA: Diagnosis not present

## 2019-08-13 DIAGNOSIS — E063 Autoimmune thyroiditis: Secondary | ICD-10-CM | POA: Diagnosis not present

## 2019-08-13 DIAGNOSIS — J3089 Other allergic rhinitis: Secondary | ICD-10-CM | POA: Diagnosis not present

## 2019-08-13 MED ORDER — MONTELUKAST SODIUM 10 MG PO TABS: 10.0000 mg | ORAL_TABLET | Freq: Every day | ORAL | 3 refills | Status: DC

## 2019-08-13 NOTE — Progress Notes (Signed)
Telemedicine Encounter- SOAP NOTE Established Patient  This telephone encounter was conducted with the patient's (or proxy's) verbal consent via audio telecommunications: yes/no: Yes Patient was instructed to have this encounter in a suitably private space; and to only have persons present to whom they give permission to participate. In addition, patient identity was confirmed by use of name plus two identifiers (DOB and address).  I discussed the limitations, risks, security and privacy concerns of performing an evaluation and management service by telephone and the availability of in person appointments. I also discussed with the patient that there may be a patient responsible charge related to this service. The patient expressed understanding and agreed to proceed.  I spent a total of TIME; 0 MIN TO 60 MIN: 15 minutes talking with the patient or their proxy.  Chief Complaint  Patient presents with  . Medication Refill    Subjective   Julia [name] is a 38 y.o. established patient. Telephone visit today for  HPI   She is compliant with her medicine  She denies concerns No hair loss, no bowel changes, no nausea or vomiting No bowel changes  No changes to sleep She takes her medication in the evening before bed and that works for her Lab Results  Component Value Date   TSH 1.020 03/20/2019    She needs a flu shot She has a nurse visit scheduled tomorrow   Patient Active Problem List   Diagnosis Date Noted  . Hypothyroidism due to Hashimoto's thyroiditis 02/25/2015  . GERD (gastroesophageal reflux disease) 02/25/2015  . Asthma 01/20/2013  . Mental handicap 07/05/2012  . HTN (hypertension) 11/14/2011    Past Medical History:  Diagnosis Date  . Allergy   . Asthma   . Hyperlipidemia   . Hypertension   . Ulcer     Current Outpatient Medications  Medication Sig Dispense Refill  . albuterol (PROVENTIL HFA;VENTOLIN HFA) 108 (90 Base) MCG/ACT inhaler Inhale 2  puffs into the lungs every 4 (four) hours as needed for wheezing or shortness of breath (cough, shortness of breath or wheezing.). 1 Inhaler 1  . EPINEPHrine 0.3 mg/0.3 mL IJ SOAJ injection Inject 0.3 mLs (0.3 mg total) into the muscle once. 1 Device 5  . ibuprofen (ADVIL,MOTRIN) 200 MG tablet Take 600 mg by mouth every 6 (six) hours as needed. Reported on 09/18/2015    . levothyroxine (SYNTHROID) 125 MCG tablet Take 1 tablet (125 mcg total) by mouth daily. 90 tablet 3  . montelukast (SINGULAIR) 10 MG tablet Take 1 tablet (10 mg total) by mouth at bedtime. 90 tablet 3  . omeprazole (PRILOSEC) 20 MG capsule Take 20 mg by mouth as needed.     No current facility-administered medications for this visit.     Allergies  Allergen Reactions  . Azithromycin Anaphylaxis    z-pac  . Latex Shortness Of Breath  . Sulfa Antibiotics Shortness Of Breath and Nausea And Vomiting  . Lisinopril     Recurrent episodes of allergic reactions possibly some angioedema she should not be on this medication.    Social History   Socioeconomic History  . Marital status: Single    Spouse name: Not on file  . Number of children: Not on file  . Years of education: Not on file  . Highest education level: Not on file  Occupational History  . Not on file  Social Needs  . Financial resource strain: Not on file  . Food insecurity    Worry: Not on  file    Inability: Not on file  . Transportation needs    Medical: Not on file    Non-medical: Not on file  Tobacco Use  . Smoking status: Never Smoker  . Smokeless tobacco: Never Used  Substance and Sexual Activity  . Alcohol use: No  . Drug use: No  . Sexual activity: Never    Birth control/protection: Abstinence  Lifestyle  . Physical activity    Days per week: Not on file    Minutes per session: Not on file  . Stress: Not on file  Relationships  . Social Herbalist on phone: Not on file    Gets together: Not on file    Attends religious  service: Not on file    Active member of club or organization: Not on file    Attends meetings of clubs or organizations: Not on file    Relationship status: Not on file  . Intimate partner violence    Fear of current or ex partner: Not on file    Emotionally abused: Not on file    Physically abused: Not on file    Forced sexual activity: Not on file  Other Topics Concern  . Not on file  Social History Narrative  . Not on file    ROS  Objective   Vitals as reported by the patient: There were no vitals filed for this visit.  Rhemi was seen today for medication refill.  Diagnoses and all orders for this visit:  Hypothyroidism due to Hashimoto's thyroiditis - discussed repeating tsh meds will be refilled after labs  Other allergic rhinitis Comments: Certainly she has environmental triggers.  If she would like to pursue additional evaluation she will advise [place].      I discussed the assessment and treatment plan with the patient. The patient was provided an opportunity to ask questions and all were answered. The patient agreed with the plan and demonstrated an understanding of the instructions.   The patient was advised to call back or seek an in-person evaluation if the symptoms worsen or if the condition fails to improve as anticipated.  I provided 15 minutes of non-face-to-face time during this encounter.  Forrest Moron, MD  Primary Care at [facility]

## 2019-08-14 ENCOUNTER — Other Ambulatory Visit: Payer: Self-pay

## 2019-08-14 ENCOUNTER — Ambulatory Visit (INDEPENDENT_AMBULATORY_CARE_PROVIDER_SITE_OTHER): Payer: BC Managed Care – PPO | Admitting: Family Medicine

## 2019-08-14 DIAGNOSIS — Z23 Encounter for immunization: Secondary | ICD-10-CM

## 2019-08-14 DIAGNOSIS — E038 Other specified hypothyroidism: Secondary | ICD-10-CM

## 2019-08-15 LAB — TSH: TSH: 3.94 u[IU]/mL (ref 0.450–4.500)

## 2019-09-01 NOTE — Progress Notes (Signed)
Nurse visit and labs

## 2020-03-14 ENCOUNTER — Telehealth: Payer: Self-pay

## 2020-03-14 DIAGNOSIS — E063 Autoimmune thyroiditis: Secondary | ICD-10-CM

## 2020-03-14 DIAGNOSIS — E038 Other specified hypothyroidism: Secondary | ICD-10-CM

## 2020-03-14 MED ORDER — LEVOTHYROXINE SODIUM 125 MCG PO TABS: 125.0000 ug | ORAL_TABLET | Freq: Every day | ORAL | 0 refills | Status: DC

## 2020-03-14 NOTE — Telephone Encounter (Signed)
RX sent

## 2020-03-14 NOTE — Telephone Encounter (Signed)
Pt. Called requesting refill of synthroid until her appt on 7/8 with santiago

## 2020-03-31 ENCOUNTER — Ambulatory Visit: Payer: BC Managed Care – PPO | Admitting: Family Medicine

## 2020-04-10 ENCOUNTER — Encounter: Payer: Self-pay | Admitting: Family Medicine

## 2020-04-10 ENCOUNTER — Other Ambulatory Visit: Payer: Self-pay

## 2020-04-10 ENCOUNTER — Ambulatory Visit: Payer: BC Managed Care – PPO | Admitting: Family Medicine

## 2020-04-10 VITALS — BP 139/90 | HR 76 | Temp 98.0°F | Ht 62.99 in | Wt 218.4 lb

## 2020-04-10 DIAGNOSIS — J4520 Mild intermittent asthma, uncomplicated: Secondary | ICD-10-CM

## 2020-04-10 DIAGNOSIS — J3089 Other allergic rhinitis: Secondary | ICD-10-CM | POA: Diagnosis not present

## 2020-04-10 DIAGNOSIS — E063 Autoimmune thyroiditis: Secondary | ICD-10-CM

## 2020-04-10 DIAGNOSIS — E038 Other specified hypothyroidism: Secondary | ICD-10-CM | POA: Diagnosis not present

## 2020-04-10 MED ORDER — EPINEPHRINE 0.3 MG/0.3ML IJ SOAJ: 0.3000 mg | Freq: Once | INTRAMUSCULAR | 0 refills | Status: AC

## 2020-04-10 MED ORDER — MONTELUKAST SODIUM 10 MG PO TABS: 10.0000 mg | ORAL_TABLET | Freq: Every day | ORAL | 3 refills | Status: AC

## 2020-04-10 MED ORDER — ALBUTEROL SULFATE HFA 108 (90 BASE) MCG/ACT IN AERS: 2.0000 | INHALATION_SPRAY | RESPIRATORY_TRACT | 11 refills | Status: AC | PRN

## 2020-04-10 NOTE — Patient Instructions (Signed)
° ° ° °  If you have lab work done today you will be contacted with your lab results within the next 2 weeks.  If you have not heard from us then please contact us. The fastest way to get your results is to register for My Chart. ° ° °IF you received an x-ray today, you will receive an invoice from [facility]. Please contact [facility] at [phone] with questions or concerns regarding your invoice.  ° °IF you received labwork today, you will receive an invoice from [facility]. Please contact [facility] at [phone] with questions or concerns regarding your invoice.  ° °Our billing staff will not be able to assist you with questions regarding bills from these companies. ° °You will be contacted with the lab results as soon as they are available. The fastest way to get your results is to activate your My Chart account. Instructions are located on the last page of this paperwork. If you have not heard from us regarding the results in 2 weeks, please contact this office. °  ° ° ° °

## 2020-04-10 NOTE — Progress Notes (Signed)
7/8/20212:53 PM  Julia [name] 1981-04-29, 39 y.o., female [id number]  Chief Complaint  Patient presents with  . Hypothyroidism    HPI:   Patient is a 39 y.o. female with past medical history significant for hypothyroidism, GERD and asthma who presents today for routine followup  Last OV June 2020 - no changes  1. Hypothyroidism -  Takes of levothyroxine, tolerates well Missed yesterday dose due to nausea and vomiting Denies any fatigue, temperature tolerance, hair or nails changes, palpitations She has gained weight and has become more emotional, occ constipation since last OV She denies any changes in diet Walks most days  2. GERD -  H/o ulcers Takes omeprazole prn now Denies any abd pain, nausea, vomiting (other than food poisoning last night), black tarry stools  3. Asthma -  Overall doing well, she did have mild exacerbation about 2 weeks ago, new apartment, albuterol resolved Smell of chlorinated water triggers Takes Singulair daily Takes albuterol as needed for wheezing, rarely in general  Denies any cough, SOB Has epi pen after incident after eating "seasoning", has never needed to use epi pen She does have anaphylaxis with abx Denies any fever or chills  Health Maintenance Due  Topic Date Due  . PAP SMEAR-Modifier  05/17/2016     Depression screen [facility] 2/9 08/13/2019 03/03/2018 11/10/2017  Decreased Interest 0 0 0  Down, Depressed, Hopeless 0 0 0  PHQ - 2 Score 0 0 0    Fall Risk  08/13/2019 09/14/2018 03/03/2018 11/10/2017 05/16/2017  Falls in the past year? 0 0 No No No  Number falls in past yr: 0 0 - - -  Injury with Fall? 0 0 - - -     Allergies  Allergen Reactions  . Azithromycin Anaphylaxis    z-pac  . Latex Shortness Of Breath  . Sulfa Antibiotics Shortness Of Breath and Nausea And Vomiting  . Lisinopril     Recurrent episodes of allergic reactions possibly some angioedema she should not be on this medication.    Prior to  Admission medications   Medication Sig Start Date End Date Taking? Authorizing Provider  albuterol (PROVENTIL HFA;VENTOLIN HFA) 108 (90 Base) MCG/ACT inhaler Inhale 2 puffs into the lungs every 4 (four) hours as needed for wheezing or shortness of breath (cough, shortness of breath or wheezing.). 09/06/16  Yes Bing Neighbors, FNP  EPINEPHrine 0.3 mg/0.3 mL IJ SOAJ injection Inject 0.3 mLs (0.3 mg total) into the muscle once. 02/27/16  Yes Collene Gobble, MD  ibuprofen (ADVIL,MOTRIN) 200 MG tablet Take 600 mg by mouth every 6 (six) hours as needed. Reported on 09/18/2015   Yes [provider]  levothyroxine (SYNTHROID) 125 MCG tablet Take 1 tablet (125 mcg total) by mouth daily. 03/14/20  Yes Sagardia, Eilleen Kempf, MD  montelukast (SINGULAIR) 10 MG tablet Take 1 tablet (10 mg total) by mouth at bedtime. 08/13/19  Yes Stallings, Zoe A, MD  omeprazole (PRILOSEC) 20 MG capsule Take 20 mg by mouth as needed.   Yes [provider]    Past Medical History:  Diagnosis Date  . Allergy   . Asthma   . Hyperlipidemia   . Hypertension   . Ulcer     Past Surgical History:  Procedure Laterality Date  . APPENDECTOMY      Social History   Tobacco Use  . Smoking status: Never Smoker  . Smokeless tobacco: Never Used  Substance Use Topics  . Alcohol use: No  Family History  Problem Relation Age of Onset  . Hypertension Mother   . Hypertension Father   . Hypertension Brother   . Asthma Maternal Grandmother   . Hypertension Maternal Grandfather   . Arthritis Paternal Grandmother   . COPD Paternal Grandfather     ROS Per hpi  OBJECTIVE:  Today's Vitals   04/10/20 1439  BP: 139/90  Pulse: 76  Temp: 98 F (36.7 C)  SpO2: 95%  Weight: 218 lb 6.4 oz (99.1 kg)  Height: 5' 2.99" (1.6 m)   Body mass index is 38.7 kg/m.   Wt Readings from Last 3 Encounters:  04/10/20 218 lb 6.4 oz (99.1 kg)  09/14/18 193 lb (87.5 kg)  03/03/18 186 lb 9.6 oz (84.6 kg)      Physical Exam Vitals and nursing note reviewed.  Constitutional:      Appearance: She is well-developed.  HENT:     Head: Normocephalic and atraumatic.     Mouth/Throat:     Pharynx: No oropharyngeal exudate.  Eyes:     General: No scleral icterus.    Conjunctiva/sclera: Conjunctivae normal.     Pupils: Pupils are equal, round, and reactive to light.  Neck:     Thyroid: No thyroid mass, thyromegaly or thyroid tenderness.  Cardiovascular:     Rate and Rhythm: Normal rate and regular rhythm.     Heart sounds: Normal heart sounds. No murmur heard.  No friction rub. No gallop.   Pulmonary:     Effort: Pulmonary effort is normal.     Breath sounds: Normal breath sounds. No wheezing or rales.  Musculoskeletal:     Cervical back: Neck supple.  Skin:    General: Skin is warm and dry.  Neurological:     Mental Status: She is alert and oriented to person, place, and time.     No results found for this or any previous visit (from the past 24 hour(s)).  No results found.   ASSESSMENT and PLAN  1. Hypothyroidism due to Hashimoto's thyroiditis Checking labs today, medications will be adjusted as needed.  - TSH  2. Mild intermittent asthma without complication Controlled. Continue current regime.  - EPINEPHrine 0.3 mg/0.3 mL IJ SOAJ injection; Inject 0.3 mLs (0.3 mg total) into the muscle once for 1 dose.  3. Other allergic rhinitis - albuterol (VENTOLIN HFA) 108 (90 Base) MCG/ACT inhaler; Inhale 2 puffs into the lungs every 4 (four) hours as needed for wheezing or shortness of breath (cough, shortness of breath or wheezing.). - montelukast (SINGULAIR) 10 MG tablet; Take 1 tablet (10 mg total) by mouth at bedtime.  Return in about 1 year (around 04/10/2021) for please schedule pap at your convinience.    Myles Lipps, MD Primary Care at [facility] [address] [place], [place] [address] Ph.  [phone] Fax [phone]

## 2020-04-11 LAB — TSH: TSH: 4.02 u[IU]/mL (ref 0.450–4.500)

## 2020-04-14 ENCOUNTER — Encounter: Payer: Self-pay | Admitting: *Deleted

## 2020-05-15 ENCOUNTER — Other Ambulatory Visit: Payer: Self-pay | Admitting: Emergency Medicine

## 2020-05-15 DIAGNOSIS — E038 Other specified hypothyroidism: Secondary | ICD-10-CM

## 2020-05-19 ENCOUNTER — Other Ambulatory Visit: Payer: Self-pay | Admitting: Family Medicine

## 2020-05-19 DIAGNOSIS — E038 Other specified hypothyroidism: Secondary | ICD-10-CM

## 2020-05-19 DIAGNOSIS — E063 Autoimmune thyroiditis: Secondary | ICD-10-CM

## 2020-05-19 MED ORDER — LEVOTHYROXINE SODIUM 125 MCG PO TABS
125.0000 ug | ORAL_TABLET | Freq: Every day | ORAL | 0 refills | Status: AC
Start: 2020-05-19 — End: ?

## 2020-05-19 NOTE — Telephone Encounter (Signed)
Copied from CRM [id number]. Topic: Quick Communication - Rx Refill/Question >> May 19, 2020  8:18 AM Jaquita Rector A wrote: Medication: levothyroxine (SYNTHROID) 125 MCG tablet  Rx sent on 05/15/20 not received by pharmacy   Has the patient contacted their pharmacy? Yes.   (Agent: If no, request that the patient contact the pharmacy for the refill.) (Agent: If yes, when and what did the pharmacy advise?)  Preferred Pharmacy (with phone number or street name): [facility] [place] - [address] [address] [address]  Phone:  [phone] Fax:  [phone]     Agent: Please be advised that RX refills may take up to 3 business days. We ask that you follow-up with your pharmacy.

## 2021-07-15 ENCOUNTER — Other Ambulatory Visit: Payer: Self-pay

## 2021-07-15 ENCOUNTER — Encounter: Payer: Self-pay | Admitting: Emergency Medicine

## 2021-07-15 ENCOUNTER — Ambulatory Visit
Admission: EM | Admit: 2021-07-15 | Discharge: 2021-07-15 | Disposition: A | Discharge status: Home / Self Care | Payer: BLUE CROSS/BLUE SHIELD | Attending: Internal Medicine | Admitting: Internal Medicine

## 2021-07-15 DIAGNOSIS — J029 Acute pharyngitis, unspecified: Secondary | ICD-10-CM | POA: Insufficient documentation

## 2021-07-15 DIAGNOSIS — J069 Acute upper respiratory infection, unspecified: Secondary | ICD-10-CM | POA: Diagnosis not present

## 2021-07-15 LAB — POCT RAPID STREP A (OFFICE): Rapid Strep A Screen: NEGATIVE

## 2021-07-15 MED ORDER — AMOXICILLIN 875 MG PO TABS: 875.0000 mg | ORAL_TABLET | Freq: Two times a day (BID) | ORAL | 0 refills | Status: AC

## 2021-07-15 NOTE — Discharge Instructions (Addendum)
You have been treated with amoxicillin antibiotic to treat respiratory infection.  Your rapid strep test was negative.  Throat culture is pending.

## 2021-07-15 NOTE — ED Triage Notes (Signed)
Patient c/o bilateral ear pain and sinus pressure x 1 week, some sore throat.  Patient has taken Ibuprofen, Benadryl and Mucinex.  Patient has been vaccinated for COVID.

## 2021-07-15 NOTE — ED Provider Notes (Signed)
[facility]    CSN: [id number] Arrival date & time: 07/15/21  1548      History   Chief Complaint Chief Complaint  Patient presents with   Otalgia    HPI Julia [name] is a 40 y.o. female.   Patient presents with bilateral ear pain, nasal congestion, sinus pressure, sore throat.  Patient reports that sore throat has been present for multiple weeks after testing positive for COVID-19 with an at home test in mid September.  Bilateral ear pain and nasal congestion started approximately 1 week ago.  Patient has been taking ibuprofen, Benadryl, Mucinex with minimal improvement in symptoms.  Patient denies any chest pain or shortness of breath.  Denies any known fevers.  Denies cough.   Otalgia  Past Medical History:  Diagnosis Date   Allergy    Asthma    Hyperlipidemia    Hypertension    Ulcer     Patient Active Problem List   Diagnosis Date Noted   Hypothyroidism due to Hashimoto's thyroiditis 02/25/2015   GERD (gastroesophageal reflux disease) 02/25/2015   Asthma 01/20/2013   Mental handicap 07/05/2012   HTN (hypertension) 11/14/2011    Past Surgical History:  Procedure Laterality Date   APPENDECTOMY      OB History   No obstetric history on file.      Home Medications    Prior to Admission medications   Medication Sig Start Date End Date Taking? Authorizing Provider  albuterol (VENTOLIN HFA) 108 (90 Base) MCG/ACT inhaler Inhale 2 puffs into the lungs every 4 (four) hours as needed for wheezing or shortness of breath (cough, shortness of breath or wheezing.). 04/10/20  Yes Lezlie Lye, Meda Coffee, MD  amoxicillin (AMOXIL) 875 MG tablet Take 1 tablet (875 mg total) by mouth 2 (two) times daily for 7 days. 07/15/21 07/22/21 Yes Lance Muss, FNP  ibuprofen (ADVIL,MOTRIN) 200 MG tablet Take 600 mg by mouth every 6 (six) hours as needed. Reported on 09/18/2015   Yes [provider]  levothyroxine (SYNTHROID) 125 MCG tablet Take 1 tablet  (125 mcg total) by mouth daily. 05/19/20  Yes Lezlie Lye, Irma M, MD  montelukast (SINGULAIR) 10 MG tablet Take 1 tablet (10 mg total) by mouth at bedtime. 04/10/20  Yes Lezlie Lye, Meda Coffee, MD  omeprazole (PRILOSEC) 20 MG capsule Take 20 mg by mouth as needed.   Yes [provider]    Family History Family History  Problem Relation Age of Onset   Hypertension Mother    Hypertension Father    Hypertension Brother    Asthma Maternal Grandmother    Hypertension Maternal Grandfather    Arthritis Paternal Grandmother    COPD Paternal Grandfather     Social History Social History   Tobacco Use   Smoking status: Never   Smokeless tobacco: Never  Substance Use Topics   Alcohol use: No   Drug use: No     Allergies   Azithromycin, Latex, Sulfa antibiotics, and Lisinopril   Review of Systems Review of Systems Per HPI  Physical Exam Triage Vital Signs ED Triage Vitals [07/15/21 1604]  Enc Vitals Group     BP 136/88     Pulse Rate 89     Resp 18     Temp 98.1 F (36.7 C)     Temp Source Oral     SpO2 98 %     Weight 215 lb (97.5 kg)     Height 5\' 4"  (1.626 m)  Head Circumference      Peak Flow      Pain Score 8     Pain Loc      Pain Edu?      Excl. in GC?    No data found.  Updated Vital Signs BP 136/88 (BP Location: Left Arm)   Pulse 89   Temp 98.1 F (36.7 C) (Oral)   Resp 18   Ht 5\' 4"  (1.626 m)   Wt 215 lb (97.5 kg)   SpO2 98%   BMI 36.90 kg/m   Visual Acuity Right Eye Distance:   Left Eye Distance:   Bilateral Distance:    Right Eye Near:   Left Eye Near:    Bilateral Near:     Physical Exam Constitutional:      General: She is not in acute distress.    Appearance: Normal appearance. She is not toxic-appearing or diaphoretic.  HENT:     Head: Normocephalic and atraumatic.     Right Ear: Ear canal normal. A middle ear effusion is present. Tympanic membrane is not erythematous or bulging.     Left Ear: Ear canal normal. A  middle ear effusion is present. Tympanic membrane is not erythematous or bulging.     Nose: Congestion present.     Mouth/Throat:     Mouth: Mucous membranes are moist.     Pharynx: Posterior oropharyngeal erythema present.  Eyes:     Extraocular Movements: Extraocular movements intact.     Conjunctiva/sclera: Conjunctivae normal.     Pupils: Pupils are equal, round, and reactive to light.  Cardiovascular:     Rate and Rhythm: Normal rate and regular rhythm.     Pulses: Normal pulses.     Heart sounds: Normal heart sounds.  Pulmonary:     Effort: Pulmonary effort is normal. No respiratory distress.     Breath sounds: Normal breath sounds. No wheezing.  Abdominal:     General: Abdomen is flat. Bowel sounds are normal.     Palpations: Abdomen is soft.  Musculoskeletal:        General: Normal range of motion.     Cervical back: Normal range of motion.  Skin:    General: Skin is warm and dry.  Neurological:     General: No focal deficit present.     Mental Status: She is alert and oriented to person, place, and time. Mental status is at baseline.  Psychiatric:        Mood and Affect: Mood normal.        Behavior: Behavior normal.     UC Treatments / Results  Labs (all labs ordered are listed, but only abnormal results are displayed) Labs Reviewed  CULTURE, GROUP A STREP [facility])  POCT RAPID STREP A (OFFICE)    EKG   Radiology No results found.  Procedures Procedures (including critical care time)  Medications Ordered in UC Medications - No data to display  Initial Impression / Assessment and Plan / UC Course  I have reviewed the triage vital signs and the nursing notes.  Pertinent labs & imaging results that were available during my care of the patient were reviewed by me and considered in my medical decision making (see chart for details).     Will treat her with amoxicillin x7 days due to duration of symptoms and continue with sore throat.  No cough, chest  pain, shortness of breath present so do not think chest imaging is necessary at this time. Symptoms seem strictly  upper respiratory. Discussed over-the-counter medications to alleviate patient's symptoms.  Patient is nontoxic-appearing and does not appear to be in need of immediate medical attention at the hospital at this time.  No red flags on exam.  Rapid strep test was negative.  Throat culture is pending. Discussed strict return precautions. Patient verbalized understanding and is agreeable with plan.  Final Clinical Impressions(s) / UC Diagnoses   Final diagnoses:  Acute upper respiratory infection  Sore throat     Discharge Instructions      You have been treated with amoxicillin antibiotic to treat respiratory infection.  Your rapid strep test was negative.  Throat culture is pending.     ED Prescriptions     Medication Sig Dispense Auth. Provider   amoxicillin (AMOXIL) 875 MG tablet Take 1 tablet (875 mg total) by mouth 2 (two) times daily for 7 days. 14 tablet Lance Muss, FNP      PDMP not reviewed this encounter.   Lance Muss, FNP 07/15/21 [phone]

## 2021-07-18 LAB — CULTURE, GROUP A STREP (THRC)
